# Patient Record
Sex: Female | Born: 1960 | Race: White | Hispanic: No | Marital: Married | Smoking: Former smoker
Health system: Southern US, Community
[De-identification: ages and names within clinical notes are randomized; demographics above are authoritative.]

## PROBLEM LIST (undated history)

## (undated) DIAGNOSIS — K5090 Crohn's disease, unspecified, without complications: Secondary | ICD-10-CM

## (undated) DIAGNOSIS — E039 Hypothyroidism, unspecified: Secondary | ICD-10-CM

## (undated) DIAGNOSIS — F329 Major depressive disorder, single episode, unspecified: Secondary | ICD-10-CM

## (undated) DIAGNOSIS — M1990 Unspecified osteoarthritis, unspecified site: Secondary | ICD-10-CM

## (undated) DIAGNOSIS — K602 Anal fissure, unspecified: Secondary | ICD-10-CM

## (undated) DIAGNOSIS — C50919 Malignant neoplasm of unspecified site of unspecified female breast: Secondary | ICD-10-CM

## (undated) DIAGNOSIS — K635 Polyp of colon: Secondary | ICD-10-CM

## (undated) DIAGNOSIS — K589 Irritable bowel syndrome without diarrhea: Secondary | ICD-10-CM

## (undated) DIAGNOSIS — F419 Anxiety disorder, unspecified: Secondary | ICD-10-CM

## (undated) DIAGNOSIS — M8580 Other specified disorders of bone density and structure, unspecified site: Secondary | ICD-10-CM

## (undated) DIAGNOSIS — K30 Functional dyspepsia: Secondary | ICD-10-CM

## (undated) DIAGNOSIS — Z8489 Family history of other specified conditions: Secondary | ICD-10-CM

## (undated) DIAGNOSIS — E0500 Thyrotoxicosis with diffuse goiter without thyrotoxic crisis or storm: Secondary | ICD-10-CM

## (undated) DIAGNOSIS — F32A Depression, unspecified: Secondary | ICD-10-CM

## (undated) DIAGNOSIS — E538 Deficiency of other specified B group vitamins: Secondary | ICD-10-CM

## (undated) DIAGNOSIS — Z92241 Personal history of systemic steroid therapy: Secondary | ICD-10-CM

## (undated) DIAGNOSIS — T7840XA Allergy, unspecified, initial encounter: Secondary | ICD-10-CM

## (undated) DIAGNOSIS — K219 Gastro-esophageal reflux disease without esophagitis: Secondary | ICD-10-CM

## (undated) HISTORY — PX: UPPER GASTROINTESTINAL ENDOSCOPY: SHX188

## (undated) HISTORY — DX: Deficiency of other specified B group vitamins: E53.8

## (undated) HISTORY — DX: Depression, unspecified: F32.A

## (undated) HISTORY — DX: Major depressive disorder, single episode, unspecified: F32.9

## (undated) HISTORY — DX: Polyp of colon: K63.5

## (undated) HISTORY — DX: Allergy, unspecified, initial encounter: T78.40XA

## (undated) HISTORY — PX: COLONOSCOPY W/ POLYPECTOMY: SHX1380

## (undated) HISTORY — DX: Anal fissure, unspecified: K60.2

## (undated) HISTORY — DX: Other specified disorders of bone density and structure, unspecified site: M85.80

## (undated) HISTORY — DX: Functional dyspepsia: K30

## (undated) HISTORY — DX: Irritable bowel syndrome, unspecified: K58.9

## (undated) HISTORY — DX: Anxiety disorder, unspecified: F41.9

## (undated) HISTORY — PX: POLYPECTOMY: SHX149

## (undated) HISTORY — PX: COLONOSCOPY: SHX174

---
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
## 1987-12-11 HISTORY — PX: TUBAL LIGATION: SHX77

## 1988-12-10 HISTORY — PX: TEMPOROMANDIBULAR JOINT SURGERY: SHX35

## 2000-08-29 ENCOUNTER — Other Ambulatory Visit: Admission: RE | Admit: 2000-08-29 | Discharge: 2000-08-29 | Payer: Self-pay | Admitting: Obstetrics & Gynecology

## 2000-09-16 ENCOUNTER — Encounter: Payer: Self-pay | Admitting: Obstetrics & Gynecology

## 2000-09-16 ENCOUNTER — Encounter: Admission: RE | Admit: 2000-09-16 | Discharge: 2000-09-16 | Payer: Self-pay | Admitting: Obstetrics & Gynecology

## 2000-12-19 ENCOUNTER — Encounter: Admission: RE | Admit: 2000-12-19 | Discharge: 2000-12-19 | Payer: Self-pay | Admitting: Obstetrics & Gynecology

## 2000-12-19 ENCOUNTER — Encounter: Payer: Self-pay | Admitting: Obstetrics & Gynecology

## 2001-01-14 ENCOUNTER — Encounter: Admission: RE | Admit: 2001-01-14 | Discharge: 2001-01-14 | Payer: Self-pay | Admitting: Emergency Medicine

## 2001-01-14 ENCOUNTER — Encounter: Payer: Self-pay | Admitting: Emergency Medicine

## 2001-01-17 ENCOUNTER — Inpatient Hospital Stay (HOSPITAL_COMMUNITY): Admission: RE | Admit: 2001-01-17 | Discharge: 2001-01-18 | Payer: Self-pay | Admitting: Gastroenterology

## 2001-01-22 ENCOUNTER — Ambulatory Visit (HOSPITAL_COMMUNITY): Admission: RE | Admit: 2001-01-22 | Discharge: 2001-01-22 | Payer: Self-pay | Admitting: Gastroenterology

## 2001-02-05 ENCOUNTER — Ambulatory Visit (HOSPITAL_COMMUNITY): Admission: RE | Admit: 2001-02-05 | Discharge: 2001-02-05 | Payer: Self-pay | Admitting: Internal Medicine

## 2001-02-05 ENCOUNTER — Encounter: Payer: Self-pay | Admitting: Internal Medicine

## 2001-05-27 ENCOUNTER — Emergency Department (HOSPITAL_COMMUNITY): Admission: EM | Admit: 2001-05-27 | Discharge: 2001-05-27 | Payer: Self-pay | Admitting: Plastic Surgery

## 2001-07-30 ENCOUNTER — Encounter: Payer: Self-pay | Admitting: Obstetrics & Gynecology

## 2001-07-30 ENCOUNTER — Encounter: Admission: RE | Admit: 2001-07-30 | Discharge: 2001-07-30 | Payer: Self-pay | Admitting: Obstetrics & Gynecology

## 2001-09-12 ENCOUNTER — Other Ambulatory Visit: Admission: RE | Admit: 2001-09-12 | Discharge: 2001-09-12 | Payer: Self-pay | Admitting: Obstetrics & Gynecology

## 2001-12-10 HISTORY — PX: OTHER SURGICAL HISTORY: SHX169

## 2002-03-19 ENCOUNTER — Inpatient Hospital Stay (HOSPITAL_COMMUNITY): Admission: AD | Admit: 2002-03-19 | Discharge: 2002-03-21 | Payer: Self-pay | Admitting: Obstetrics and Gynecology

## 2002-03-20 ENCOUNTER — Encounter: Payer: Self-pay | Admitting: Obstetrics & Gynecology

## 2002-03-26 ENCOUNTER — Encounter (INDEPENDENT_AMBULATORY_CARE_PROVIDER_SITE_OTHER): Payer: Self-pay | Admitting: Specialist

## 2002-03-26 ENCOUNTER — Ambulatory Visit (HOSPITAL_COMMUNITY): Admission: RE | Admit: 2002-03-26 | Discharge: 2002-03-26 | Payer: Self-pay | Admitting: Gastroenterology

## 2002-04-14 ENCOUNTER — Encounter: Payer: Self-pay | Admitting: Gastroenterology

## 2002-04-14 ENCOUNTER — Encounter: Admission: RE | Admit: 2002-04-14 | Discharge: 2002-04-14 | Payer: Self-pay | Admitting: Gastroenterology

## 2002-05-29 ENCOUNTER — Encounter: Payer: Self-pay | Admitting: Gastroenterology

## 2002-05-29 ENCOUNTER — Inpatient Hospital Stay (HOSPITAL_COMMUNITY): Admission: RE | Admit: 2002-05-29 | Discharge: 2002-06-04 | Payer: Self-pay | Admitting: Gastroenterology

## 2002-06-03 ENCOUNTER — Encounter: Payer: Self-pay | Admitting: Gastroenterology

## 2002-06-11 ENCOUNTER — Ambulatory Visit (HOSPITAL_COMMUNITY): Admission: RE | Admit: 2002-06-11 | Discharge: 2002-06-11 | Payer: Self-pay | Admitting: Gastroenterology

## 2002-06-16 ENCOUNTER — Encounter: Admission: RE | Admit: 2002-06-16 | Discharge: 2002-06-16 | Payer: Self-pay | Admitting: Gastroenterology

## 2002-06-16 ENCOUNTER — Encounter: Payer: Self-pay | Admitting: Gastroenterology

## 2002-09-02 ENCOUNTER — Encounter (HOSPITAL_COMMUNITY): Admission: RE | Admit: 2002-09-02 | Discharge: 2002-09-02 | Payer: Self-pay | Admitting: Gastroenterology

## 2002-10-01 ENCOUNTER — Encounter: Admission: RE | Admit: 2002-10-01 | Discharge: 2002-10-01 | Payer: Self-pay | Admitting: Gastroenterology

## 2002-10-01 ENCOUNTER — Encounter: Payer: Self-pay | Admitting: Gastroenterology

## 2002-10-26 ENCOUNTER — Encounter (HOSPITAL_COMMUNITY): Admission: RE | Admit: 2002-10-26 | Discharge: 2002-10-26 | Payer: Self-pay | Admitting: Gastroenterology

## 2002-11-04 ENCOUNTER — Other Ambulatory Visit: Admission: RE | Admit: 2002-11-04 | Discharge: 2002-11-04 | Payer: Self-pay | Admitting: Obstetrics & Gynecology

## 2002-11-18 ENCOUNTER — Encounter: Payer: Self-pay | Admitting: Obstetrics & Gynecology

## 2002-11-18 ENCOUNTER — Encounter: Admission: RE | Admit: 2002-11-18 | Discharge: 2002-11-18 | Payer: Self-pay | Admitting: Family Medicine

## 2002-11-20 ENCOUNTER — Encounter: Payer: Self-pay | Admitting: Gastroenterology

## 2002-11-20 ENCOUNTER — Encounter: Admission: RE | Admit: 2002-11-20 | Discharge: 2002-11-20 | Payer: Self-pay | Admitting: Gastroenterology

## 2002-12-10 HISTORY — PX: APPENDECTOMY: SHX54

## 2002-12-10 HISTORY — PX: ABDOMINAL HYSTERECTOMY: SHX81

## 2002-12-10 HISTORY — PX: BOWEL RESECTION: SHX1257

## 2003-11-23 ENCOUNTER — Ambulatory Visit (HOSPITAL_COMMUNITY): Admission: RE | Admit: 2003-11-23 | Discharge: 2003-11-23 | Payer: Self-pay | Admitting: Obstetrics & Gynecology

## 2004-01-31 ENCOUNTER — Encounter: Admission: RE | Admit: 2004-01-31 | Discharge: 2004-01-31 | Payer: Self-pay | Admitting: Gastroenterology

## 2004-12-12 ENCOUNTER — Ambulatory Visit (HOSPITAL_COMMUNITY): Admission: RE | Admit: 2004-12-12 | Discharge: 2004-12-12 | Payer: Self-pay | Admitting: Obstetrics & Gynecology

## 2004-12-27 ENCOUNTER — Other Ambulatory Visit: Admission: RE | Admit: 2004-12-27 | Discharge: 2004-12-27 | Payer: Self-pay | Admitting: Obstetrics & Gynecology

## 2005-11-12 ENCOUNTER — Encounter: Admission: RE | Admit: 2005-11-12 | Discharge: 2005-11-12 | Payer: Self-pay | Admitting: Gastroenterology

## 2006-03-25 ENCOUNTER — Other Ambulatory Visit: Admission: RE | Admit: 2006-03-25 | Discharge: 2006-03-25 | Payer: Self-pay | Admitting: Obstetrics and Gynecology

## 2007-07-03 ENCOUNTER — Ambulatory Visit (HOSPITAL_COMMUNITY): Admission: RE | Admit: 2007-07-03 | Discharge: 2007-07-03 | Payer: Self-pay | Admitting: Obstetrics & Gynecology

## 2007-11-29 ENCOUNTER — Emergency Department (HOSPITAL_COMMUNITY): Admission: EM | Admit: 2007-11-29 | Discharge: 2007-11-29 | Payer: Self-pay | Admitting: Family Medicine

## 2011-04-27 NOTE — H&P (Signed)
[facility]  Patient:    [name], [name] Visit Number: [id number] MRN: [id number]          Service Type: OBS Location: [place] 01 Attending Physician:  Renella Cunas Dictated by:   Maisie Fus, M.D. Admit Date:  03/19/2002 Discharge Date: 03/21/2002                           History and Physical  SOCIAL SECURITY NO.           [id number].  ADMITTING DIAGNOSIS:          Persistent right lower quadrant abdominal pain.  HISTORY OF PRESENT ILLNESS:   The patient is a 50 year old white married female, gravida 3, para 3, who has been followed in my office since September 2001.  At the time of her initial examination in September 2001, she had a completely normal GYN exam.  She presented to the office March 5 complaining of right lower quadrant lower abdominal and vaginal pain, which she stated started with the onset of her menses and had progressively worsened since the onset of her menses on February 23.  She also complained of dyspareunia.  On pelvic examination at that time, the uterus was felt to be slightly enlarged in size.  There was a tender cystic mass in the right adnexa, and on pelvic ultrasound there was a 1.7 x 3 x 1.3 complex cystic lesion in the right ovary with jagged borders and free fluid seen about the anterior and posterior cul-de-sac.  The left adnexa was normal.  The uterus itself was normal.  It was felt to be most consistent with a ruptured ovarian cyst, and she was given Vicodin for pain and scheduled to return to the office for follow-up in approximately four to five weeks.  On the day of admission she presented to the office complaining that her pain had continued.  She complained of chills and sweats, lower abdominal pressure, and abdominal bloating and nausea.  On examination in the office at that time, she was initially examined by Juanda Chance, my nurse practitioner, who felt that she did have a cystic  fullness in the pelvis and a pelvic ultrasound was obtained, and that ultrasound showed a 1.8 cm resolving cyst with internal echoes on the right side and a large amount of free fluid in the cul-de-sac.  Her abdominal exam at that time was remarkable for right lower quadrant tenderness with mild rebound referred to the right lower quadrant.  On pelvic exam she also was noted to have cervical motion tenderness.  Based on these findings she was admitted for additional management and evaluation.  REVIEW OF SYSTEMS:            Her current review of systems is otherwise negative.  PAST MEDICAL HISTORY:         The patient has a history of peptic ulcer disease.  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  She has previously had temporomandibular joint surgery approximately seven years ago and had a bilateral tubal ligation approximately 11 years ago.  ALLERGIES:                    Her only known allergy is to ASPIRIN.  SOCIAL HISTORY:               She has  never had a blood transfusion.  She does use one pack per day of cigarettes but does not use alcohol.  FAMILY HISTORY:               Noncontributory.  PHYSICAL EXAMINATION:  GENERAL:                      The patient is alert, oriented, and cooperative.  HEENT:                        Grossly within normal limits.  NECK:                         The thyroid gland is not palpably enlarged.  CHEST:                        Clear to auscultation.  CARDIAC:                      Normal sinus rhythm without murmurs, rubs, or gallops.  ABDOMEN:                      Remarkable as noted above with tenderness to deep palpation in the right lower quadrant and mild rebound.  PELVIC:                       Remarkable as noted above.  EXTREMITIES:                  Without cyanosis, clubbing, or edema.  ASSESSMENT:                   Persistent right lower  quadrant and right pelvic pain of undetermined etiology, initially thought to be related to a ruptured ovarian cyst but with resolution of the cyst and persistence of symptoms, the possibility of chronic appendicitis is entertained as well as other possible GYN pathology or GI pathology.  PLAN:                         Admission for IV antibiotics, for a CT scan of the abdomen and pelvis, and further management as appropriate. Dictated by:   Maisie Fus, M.D. Attending Physician:  Renella Cunas DD:  03/20/02 TD:  03/20/02 Job: [id number]

## 2011-04-27 NOTE — Procedures (Signed)
[place]  Patient:    [name], [name] Visit Number: [id number] MRN: [id number]          Service Type: END Location: ENDO Attending Physician:  Mauri Brooklyn Ii Dictated by:   Mickeal Skinner, M.D. Proc. Date: 03/26/02 Admit Date:  03/26/2002 Discharge Date: 03/26/2002   CC:         Maisie Fus, M.D.  Zella Richer Burnett Harry, M.D.   Procedure Report  DATE OF BIRTH:  09-13-1961.  REFERRING PHYSICIAN: 1. Zella Richer Burnett Harry, M.D. 2. Maisie Fus, M.D.  PROCEDURE PERFORMED:  Colonoscopy, distal ileoscopy, ileal biopsy and sigmoid polypectomy.  ENDOSCOPIST:  Mickeal Skinner, M.D.  INDICATIONS FOR PROCEDURE:  The patient is a 50 year old female, 64.  She was recently hospitalized with abdominal pain.  Abdominal CT revealed signs suggesting ileitis.  [name] also has undergone I-131 radioiodine therapy to treat hyperthyroidism.  PREMEDICATION:  Versed 10 mg, fentanyl 100 mcg.  ENDOSCOPE:  Olympus pediatric colonoscope.  DESCRIPTION OF PROCEDURE:  After obtaining informed consent, the patient was placed in the left lateral decubitus position.  I administered intravenous fentanyl and intravenous Versed to achieve conscious sedation for the procedure.  The patients blood pressure, oxygen saturation and cardiac rhythm were monitored throughout the procedure and documented in the medical record.  Anal inspection was normal.  Digital rectal exam was normal.  The Olympus pediatric video colonoscope was then introduced into the rectum and easily advanced to the cecum.  A somewhat inflamed ileocecal valve was intubated and the distal ileum inspected.  Colonic preparation for the exam today was excellent.  Rectum:  Normal.  Sigmoid colon and descending colon:  At 25 cm from the anal verge, a 2 mm sessile polyp was removed with electrocautery snare and submitted for pathologic interpretation.  There were scattered aphthous  type ulcers and mucosal hemorrhages involving the left colon.  Splenic flexure:  Normal.  Transverse colon:  Scattered mucosal hemorrhages and aphthous type ulcers.  Hepatic flexure:  Normal.  Ascending colon:  Scattered mucosal hemorrhages.  Cecum and ileocecal valve:  Scattered mucosal hemorrhages and aphthous type ulcers.  Ileocecal valve is quite edematous.  Distal ileum:  The distal ileum is very edematous, somewhat friable and there are scattered ulcers.  Biopsies were performed.  ASSESSMENT: 1. Ileitis--universal colitis with a normal-appearing rectum.  Rule out    Crohns ileocolitis. 2. Distal sigmoid colon polyp removed. Dictated by:   Mickeal Skinner, M.D. Attending Physician:  Mauri Brooklyn Ii DD:  03/26/02 TD:  03/26/02 Job: [id number]

## 2011-04-27 NOTE — Discharge Summary (Signed)
[facility]  Patient:    [name], [name] Visit Number: [id number] MRN: [id number]          Service Type: OBS Location: [place] 01 Attending Physician:  Renella Cunas Dictated by:   Maisie Fus, M.D. Admit Date:  03/19/2002 Discharge Date: 03/21/2002   CC:         Earle Gell, M.D.   Discharge Summary  DISCHARGE DIAGNOSES:          1. Right lower quadrant abdominal pain.                               2. Recently ruptured ovarian cyst.                               3. CT findings of thickening terminal ileum                                  and small-bowel loops in the pelvis,                                  possibly Crohns disease.  CONDITION UPON DISCHARGE:     Satisfactory.  ACTIVITY:                     Restricted.  DISCHARGE MEDICATIONS:        1. Tequin 400 mg q.d.                               2. Percocet 5 mg 1 to 2 q.4-6h. p.r.n.                                  postoperative pain.  DIET:                         Regular as tolerated.  FOLLOWUP:                     Return to my office in approximately five days for followup.  She is to contact Dr. Marcelene Butte office for GI followup.  HISTORY OF PRESENT ILLNESS:   Details of the present illness are recorded in the admission note as well as physical findings.  Physical findings were primarily remarkable for pelvic and lower abdominal tenderness.  LABORATORY DATA:              Done this admission, includes CT scan of the abdomen and pelvis with findings as noted in the Discharge Diagnoses.  CBC on admission showed a white count of 11.3 with a marked left shift with 80 neutrophils.  Her hemoglobin on admission was 11.8, platelet count 407,000. Followup CBC on the day of discharge included a white count of 10.7 with 74 neutrophils.  Hemoglobin 11.0, platelet count 380,000.  CMET panel on admission was normal.  CT scan as noted did show abnormal findings as noted  above.  HOSPITAL COURSE:              The patient was admitted on the hospital, started on intravenous fluids, started  on Tequin 400 mg IV per day.  She had laboratory evaluation as noted above.  She had some slight improvement over the course of her hospital stay but was still complaining of lower abdominal discomfort.  She did not have an acute abdomen at this point and requested discharge.  She was seen in consultation during this admission by Dr. Watt Climes for Dr. Earle Gell.  GI followup will be with Dr. Earle Gell. Dictated by:   Maisie Fus, M.D. Attending Physician:  Renella Cunas DD:  03/21/02 TD:  03/22/02 Job: [id number]

## 2011-04-27 NOTE — Consult Note (Signed)
[place]  Patient:    [name], PICADO Visit Number: [id number] MRN: [id number]          Service Type: OBS Location: 0881 1031 01 Attending Physician:  Eber Hong Ii Dictated by:   Jeryl Columbia, M.D. Consultation Date: 03/20/02 Admit Date:  03/19/2002   CC:         Jeryl Columbia, M.D.  Maisie Fus, M.D.  Garlan Fair III, M.D.   Consultation Report  HISTORY OF PRESENT ILLNESS:   The patient admitted with right upper quadrant pain.  CT scan showed a free sac of fluid in the pelvis.  She has been followed by Dr. Viona Gilmore. Evette Cristal for ovarian cyst but also there was some small bowel thickening compatible with a question of Crohns.  She has seen Dr. Garlan Fair III in the past for various GI complaints.  Workup has been nondiagnostic in the past, although I am not sure she had a small-bowel follow-through.  Her GI symptoms have waxed and waned, initially thought secondary to some hypothyroidism which she had gotten radiation for.  Over the last three weeks, she has had lower abdominal pain and cramping with some increase in her diarrhea, although she only goes after she eats.  It does not wake her up at night.  She has had some low grade fevers and night sweats, however, for about three weeks.  No significant eye complaints, joint swelling, or rashes.  She has not had much upper tract symptoms, nausea or vomiting.  PAST MEDICAL HISTORY:         Pertinent for tubal ligation, TMJ surgery, as well as the hypothyroidism.  SOCIAL HISTORY:               Does smoke.  Occasionally drinks.  Does use vitamin E but minimized other over-the-counter medicines.  CURRENT MEDICATIONS:          Birth control pills, Vicodin, Xanax.  FAMILY HISTORY:               Negative for any obvious GI problems, Crohns, or colitis.  REVIEW OF SYSTEMS:            Pertinent for some pressure with urination but her pain does not change with moving  her bowels and she is not seeing any blood.  She says she has lost about 30 pounds but most of that was when her thyroid was first diagnosed a year and a half ago.  PHYSICAL EXAMINATION:  GENERAL:                      No acute distress, lying comfortably in the bed, although is on PCA Morphine pump.  VITAL SIGNS:                  Stable and afebrile.  HEENT:                        Sclerae are nonicteric.  NECK:                         Supple without obvious adenopathy.  LUNGS:                        Clear.  HEART:  Regular rate and rhythm.  ABDOMEN:                      Soft.  There is some tenderness throughout. Doubt any rebound tenderness.  Very minimal lower guarding.  LABORATORY DATA:              Pertinent for a slight increase in white count of 11.3.  Segs 90%.  Hemoglobin 11.8 with a MCV of 86, platelets of 407,000. Chemistries normal including an albumin of 3.7.  Normal liver tests.  CT scan reportedly reviewed.  Pertinent for small bowel thickening involving the distal small bowel and some small amount of free pelvic fluid.  ASSESSMENT:                   Possible Crohns disease in a patient with pain, abnormal CT, off and on diarrhea, and weight loss, but also some of this could be due to her thyroid problem and possibly an ovarian cyst rupture would explain some of the fluid.  PLAN:                         We will go ahead and begin Pentasa in case this is Crohns disease.  Do not think we will need to start prednisone quite yet. We will need a small-bowel follow-through, colonoscopy with biopsy to prove the diagnosis, and I believe Dr. Garlan Fair III can probably proceed with that next week as an outpatient.  I think she could go home if we can control the pain since she is not having any nausea or vomiting.  Might want to try either a Fentanyl patch or Percocet.  We will follow with you and check on her tomorrow.  Agree with repeating  her CBC and agree with Tequin for now. Thank you very much for the consultation.Dictated by:   Jeryl Columbia, M.D.  Attending Physician:  Renella Cunas DD:  03/20/02 TD:  03/21/02 Job: [id number]

## 2012-09-11 ENCOUNTER — Other Ambulatory Visit: Payer: Self-pay | Admitting: Gastroenterology

## 2012-09-12 ENCOUNTER — Emergency Department (HOSPITAL_COMMUNITY): Payer: BC Managed Care – PPO

## 2012-09-12 ENCOUNTER — Encounter (HOSPITAL_COMMUNITY): Payer: Self-pay

## 2012-09-12 ENCOUNTER — Emergency Department (HOSPITAL_COMMUNITY)
Admission: EM | Admit: 2012-09-12 | Discharge: 2012-09-13 | Disposition: A | Discharge status: Home / Self Care | Payer: BC Managed Care – PPO | Attending: Emergency Medicine | Admitting: Emergency Medicine

## 2012-09-12 DIAGNOSIS — R142 Eructation: Secondary | ICD-10-CM | POA: Insufficient documentation

## 2012-09-12 DIAGNOSIS — E039 Hypothyroidism, unspecified: Secondary | ICD-10-CM | POA: Insufficient documentation

## 2012-09-12 DIAGNOSIS — R140 Abdominal distension (gaseous): Secondary | ICD-10-CM

## 2012-09-12 DIAGNOSIS — R141 Gas pain: Secondary | ICD-10-CM | POA: Insufficient documentation

## 2012-09-12 DIAGNOSIS — Z79899 Other long term (current) drug therapy: Secondary | ICD-10-CM | POA: Insufficient documentation

## 2012-09-12 DIAGNOSIS — R110 Nausea: Secondary | ICD-10-CM | POA: Insufficient documentation

## 2012-09-12 DIAGNOSIS — R109 Unspecified abdominal pain: Secondary | ICD-10-CM

## 2012-09-12 DIAGNOSIS — R197 Diarrhea, unspecified: Secondary | ICD-10-CM | POA: Insufficient documentation

## 2012-09-12 DIAGNOSIS — R143 Flatulence: Secondary | ICD-10-CM | POA: Insufficient documentation

## 2012-09-12 DIAGNOSIS — K5090 Crohn's disease, unspecified, without complications: Secondary | ICD-10-CM | POA: Insufficient documentation

## 2012-09-12 HISTORY — DX: Hypothyroidism, unspecified: E03.9

## 2012-09-12 HISTORY — DX: Thyrotoxicosis with diffuse goiter without thyrotoxic crisis or storm: E05.00

## 2012-09-12 HISTORY — DX: Crohn's disease, unspecified, without complications: K50.90

## 2012-09-12 LAB — CBC WITH DIFFERENTIAL/PLATELET
Basophils Absolute: 0 10*3/uL (ref 0.0–0.1)
Basophils Relative: 0 % (ref 0–1)
Eosinophils Absolute: 0.2 10*3/uL (ref 0.0–0.7)
Eosinophils Relative: 2 % (ref 0–5)
HCT: 40.7 % (ref 36.0–46.0)
Lymphocytes Relative: 28 % (ref 12–46)
Lymphs Abs: 1.9 10*3/uL (ref 0.7–4.0)
MCHC: 34.2 g/dL (ref 30.0–36.0)
MCV: 93.8 fL (ref 78.0–100.0)
Monocytes Absolute: 0.4 10*3/uL (ref 0.1–1.0)
Monocytes Relative: 6 % (ref 3–12)
Neutro Abs: 4.3 10*3/uL (ref 1.7–7.7)
Neutrophils Relative %: 63 % (ref 43–77)
Platelets: 253 10*3/uL (ref 150–400)
RBC: 4.34 MIL/uL (ref 3.87–5.11)
RDW: 12.3 % (ref 11.5–15.5)
WBC: 6.8 10*3/uL (ref 4.0–10.5)

## 2012-09-12 LAB — COMPREHENSIVE METABOLIC PANEL
ALT: 8 U/L (ref 0–35)
AST: 12 U/L (ref 0–37)
Albumin: 3.7 g/dL (ref 3.5–5.2)
CO2: 23 mEq/L (ref 19–32)
Calcium: 8.5 mg/dL (ref 8.4–10.5)
Chloride: 105 mEq/L (ref 96–112)
Creatinine, Ser: 0.65 mg/dL (ref 0.50–1.10)
GFR calc Af Amer: >90 mL/min (ref >90)
GFR calc non Af Amer: >90 mL/min (ref >90)
Glucose, Bld: 105 mg/dL — ABNORMAL HIGH (ref 70–99)
Potassium: 3.3 mEq/L — ABNORMAL LOW (ref 3.5–5.1)
Sodium: 137 mEq/L (ref 135–145)
Total Bilirubin: 0.6 mg/dL (ref 0.3–1.2)

## 2012-09-12 MED ORDER — SODIUM CHLORIDE 0.9 % IV BOLUS (SEPSIS)
1000.0000 mL | Freq: Once | INTRAVENOUS | Status: AC
  Administered 2012-09-13: 1000 mL via INTRAVENOUS

## 2012-09-12 MED ORDER — POTASSIUM CHLORIDE CRYS ER 20 MEQ PO TBCR
40.0000 meq | EXTENDED_RELEASE_TABLET | Freq: Once | ORAL | Status: AC
  Administered 2012-09-13: 40 meq via ORAL
  Filled 2012-09-12: qty 2

## 2012-09-12 MED ORDER — HYDROMORPHONE HCL PF 1 MG/ML IJ SOLN
1.0000 mg | Freq: Once | INTRAMUSCULAR | Status: DC
  Filled 2012-09-12: qty 1

## 2012-09-12 MED ORDER — HYDROMORPHONE HCL PF 1 MG/ML IJ SOLN
1.0000 mg | Freq: Once | INTRAMUSCULAR | Status: AC
  Administered 2012-09-12: 1 mg via INTRAVENOUS

## 2012-09-12 MED ORDER — IOHEXOL 300 MG/ML  SOLN
100.0000 mL | Freq: Once | INTRAMUSCULAR | Status: AC | PRN
  Administered 2012-09-12: 100 mL via INTRAVENOUS

## 2012-09-12 NOTE — ED Notes (Signed)
Pt had colonoscopy done yesterday for crohn. Pt today has abdominal distention with pain on right side of her abdomen, been having diarrhea since the colonoscopy. Pt is alert, oriented, appear in no acute distress. This is pt's fifth colonoscopy and never had this problem before.

## 2012-09-12 NOTE — ED Provider Notes (Signed)
History     CSN: [id number]  Arrival date & time 09/12/12  1804   First MD Initiated Contact with Patient 09/12/12 2042      Chief Complaint  Patient presents with  . Abdominal Pain    (Consider location/radiation/quality/duration/timing/severity/associated sxs/prior treatment) The history is provided by the patient.  [name] is a 51 y.o. female hx of Crohn's disease s/p partial bowel resection here with abdominal pain and distention. She had a colonoscopy yesterday and had a biopsy. After the procedure, she had distended abdomen and diffuse pain. + nausea but no vomiting. + passing gas and had some diarrhea. No fevers. She had multiple colonoscopy before but never had these symptoms.    Past Medical History  Diagnosis Date  . Crohn disease   . Graves disease   . Hypothyroidism     Past Surgical History  Procedure Date  . Radioiodine therapy   . Bowel resection 2004  . Abdominal hysterectomy   . Appendectomy     No family history on file.  History  Substance Use Topics  . Smoking status: Current Every Day Smoker  . Smokeless tobacco: Not on file  . Alcohol Use: No    OB History    Grav Para Term Preterm Abortions TAB SAB Ect Mult Living                  Review of Systems  Gastrointestinal: Positive for nausea, abdominal pain, diarrhea and abdominal distention.  All other systems reviewed and are negative.    Allergies  Aspirin adult low  Home Medications   Current Outpatient Rx  Name Route Sig Dispense Refill  . ALPRAZOLAM 1 MG PO TABS Oral Take 1 mg by mouth at bedtime.    Marland Kitchen VITAMIN D 1000 UNITS PO TABS Oral Take 1,000 Units by mouth daily.    Marland Kitchen ESTRADIOL 0.1 MG/24HR TD PTTW Transdermal Place 1 patch onto the skin 2 (two) times a week. Every 3 days    . LEVOTHYROXINE SODIUM 75 MCG PO TABS Oral Take 75 mcg by mouth daily.    . ADULT MULTIVITAMIN W/MINERALS CH Oral Take 1 tablet by mouth daily.    Marland Kitchen ZOLPIDEM TARTRATE 10 MG PO TABS Oral Take 10  mg by mouth at bedtime.      BP 104/43  Pulse 70  Temp 98 F (36.7 C) (Oral)  Resp 18  SpO2 100%  Physical Exam  Nursing note and vitals reviewed. Constitutional: She is oriented to person, place, and time. She appears well-developed.       Slightly uncomfortable   HENT:  Head: Normocephalic.  Mouth/Throat: Oropharynx is clear and moist.  Eyes: Conjunctivae normal are normal. Pupils are equal, round, and reactive to light.  Neck: Normal range of motion. Neck supple.  Cardiovascular: Normal rate, regular rhythm and normal heart sounds.   Pulmonary/Chest: Effort normal and breath sounds normal.  Abdominal: Soft.       Increased breath sound. + abdominal distension. Tenderness RLQ over the incision site and RUQ and periumbilical area. No rebound.   Musculoskeletal: Normal range of motion. She exhibits no edema.  Neurological: She is alert and oriented to person, place, and time.  Skin: Skin is warm and dry.  Psychiatric: She has a normal mood and affect. Her behavior is normal. Judgment and thought content normal.    ED Course  Procedures (including critical care time)  Labs Reviewed  COMPREHENSIVE METABOLIC PANEL - Abnormal; Notable for the following:  Potassium 3.3 (*)     Glucose, Bld 105 (*)     All other components within normal limits  CBC WITH DIFFERENTIAL   Dg Abd Acute W/chest  09/12/2012  *RADIOLOGY REPORT*  Clinical Data: Abdominal pain  ACUTE ABDOMEN SERIES (ABDOMEN 2 VIEW & CHEST 1 VIEW)  Comparison: CT 11/12/2005  Findings: Lungs clear.  Heart size normal.  No effusion.  No free air.  Normal bowel gas pattern.  Prominent liver.  No abnormal abdominal calcifications.  Regional bones unremarkable.  IMPRESSION:  1.  Normal bowel gas pattern. 2.  No free air.   Original Report Authenticated By: Dillard Cannon III, M.D.      1. Abdominal distention   2. Abdominal pain       MDM  [name] is a 51 y.o. female here with abdominal pain and distention.  Will need to r/o ileus vs perforation. Will get labs, abdominal xray and reassess.    11:25 PM I discussed with Dr. Durenda Age (patient's GI doctor) partner. If labs and CT nl, she will call the office on Monday. I signed out to Dr. Florina Ou to follow up CT ab/pel.       Wandra Arthurs, MD 09/12/12 [phone]

## 2012-09-12 NOTE — ED Notes (Signed)
Patient transported to CT 

## 2012-09-13 MED ORDER — OXYCODONE-ACETAMINOPHEN 5-325 MG PO TABS: 1.0000 | ORAL_TABLET | Freq: Four times a day (QID) | ORAL | Status: DC | PRN

## 2012-09-13 NOTE — ED Provider Notes (Signed)
Nursing notes and vitals signs, including pulse oximetry, reviewed.  Summary of this visit's results, reviewed by myself:  Labs:  Results for orders placed during the hospital encounter of 09/12/12  CBC WITH DIFFERENTIAL      Component Value Range   WBC 6.8  4.0 - 10.5 K/uL   RBC 4.34  3.87 - 5.11 MIL/uL   Hemoglobin 13.9  12.0 - 15.0 g/dL   HCT 40.7  36.0 - 46.0 %   MCV 93.8  78.0 - 100.0 fL   MCH 32.0  26.0 - 34.0 pg   MCHC 34.2  30.0 - 36.0 g/dL   RDW 12.3  11.5 - 15.5 %   Platelets 253  150 - 400 K/uL   Neutrophils Relative 63  43 - 77 %   Neutro Abs 4.3  1.7 - 7.7 K/uL   Lymphocytes Relative 28  12 - 46 %   Lymphs Abs 1.9  0.7 - 4.0 K/uL   Monocytes Relative 6  3 - 12 %   Monocytes Absolute 0.4  0.1 - 1.0 K/uL   Eosinophils Relative 2  0 - 5 %   Eosinophils Absolute 0.2  0.0 - 0.7 K/uL   Basophils Relative 0  0 - 1 %   Basophils Absolute 0.0  0.0 - 0.1 K/uL  COMPREHENSIVE METABOLIC PANEL      Component Value Range   Sodium 137  135 - 145 mEq/L   Potassium 3.3 (*) 3.5 - 5.1 mEq/L   Chloride 105  96 - 112 mEq/L   CO2 23  19 - 32 mEq/L   Glucose, Bld 105 (*) 70 - 99 mg/dL   BUN 7  6 - 23 mg/dL   Creatinine, Ser 0.65  0.50 - 1.10 mg/dL   Calcium 8.5  8.4 - 10.5 mg/dL   Total Protein 6.4  6.0 - 8.3 g/dL   Albumin 3.7  3.5 - 5.2 g/dL   AST 12  0 - 37 U/L   ALT 8  0 - 35 U/L   Alkaline Phosphatase 56  39 - 117 U/L   Total Bilirubin 0.6  0.3 - 1.2 mg/dL   GFR calc non Af Amer >90  >90 mL/min   GFR calc Af Amer >90  >90 mL/min    Imaging Studies: Ct Abdomen Pelvis W Contrast  09/13/2012  *RADIOLOGY REPORT*  Clinical Data: Abdominal pain.  Recent colonoscopy.  CT ABDOMEN AND PELVIS WITH CONTRAST  Technique:  Multidetector CT imaging of the abdomen and pelvis was performed following the standard protocol during bolus administration of intravenous contrast.  Contrast: 164m OMNIPAQUE IOHEXOL 300 MG/ML  SOLN  Comparison: 11/12/2005  Findings: Visualized lung bases clear.   Unremarkable liver, gallbladder, spleen, adrenal glands, kidneys.  There is mild dilatation of the pancreatic duct, slightly increased since previous exam.  Portal vein is patent.  Scattered aortoiliac atheromatous calcifications without aneurysm.  The stomach is physiologically distended.  Small bowel is nondilated.  Anastomotic staple line in the proximal ascending colon, new since prior study. The colon is incompletely distended, unremarkable. No focal inflammatory change.  No free air.  No ascites.  Urinary bladder incompletely distended.  No pelvic, retroperitoneal, or mesenteric adenopathy.  Mild spondylitic changes in the lower lumbar spine. No hydronephrosis or proximal ureterectasis.  IMPRESSION:  1.  No free air or other acute abdominal process. 2.  Interval postop changes in the proximal ascending colon.   Original Report Authenticated By: DTrecia Rogers M.D.    Dg Abd  Acute W/chest  09/12/2012  *RADIOLOGY REPORT*  Clinical Data: Abdominal pain  ACUTE ABDOMEN SERIES (ABDOMEN 2 VIEW & CHEST 1 VIEW)  Comparison: CT 11/12/2005  Findings: Lungs clear.  Heart size normal.  No effusion.  No free air.  Normal bowel gas pattern.  Prominent liver.  No abnormal abdominal calcifications.  Regional bones unremarkable.  IMPRESSION:  1.  Normal bowel gas pattern. 2.  No free air.   Original Report Authenticated By: Dillard Cannon III, M.D.     12:14 AM Patient still having some right lower quadrant pain and tenderness. Abdomen is soft. Patient was advised of unremarkable CT findings.  Wynetta Fines, MD 09/13/12 [phone]

## 2013-01-24 ENCOUNTER — Other Ambulatory Visit: Payer: Self-pay

## 2013-06-19 ENCOUNTER — Encounter (INDEPENDENT_AMBULATORY_CARE_PROVIDER_SITE_OTHER): Payer: Self-pay

## 2013-06-26 ENCOUNTER — Ambulatory Visit (INDEPENDENT_AMBULATORY_CARE_PROVIDER_SITE_OTHER): Payer: PRIVATE HEALTH INSURANCE | Admitting: General Surgery

## 2013-06-26 ENCOUNTER — Telehealth (INDEPENDENT_AMBULATORY_CARE_PROVIDER_SITE_OTHER): Payer: Self-pay | Admitting: General Surgery

## 2013-06-26 ENCOUNTER — Encounter (INDEPENDENT_AMBULATORY_CARE_PROVIDER_SITE_OTHER): Payer: Self-pay | Admitting: General Surgery

## 2013-06-26 VITALS — BP 122/70 | HR 84 | Resp 14 | Ht 60.0 in | Wt 114.0 lb

## 2013-06-26 DIAGNOSIS — K429 Umbilical hernia without obstruction or gangrene: Secondary | ICD-10-CM

## 2013-06-26 NOTE — Telephone Encounter (Signed)
Noted  

## 2013-06-26 NOTE — Progress Notes (Signed)
Patient ID: [name], female   DOB: [birth year], 52 y.o.   MRN: [id number]  Chief Complaint  Patient presents with  . Umbilical Hernia    HPI [name] is a 52 y.o. female.   HPI  She is referred by Dr. Earle Gell for further evaluation and treatment of a enlarging, slightly symptomatic umbilical hernia. She underwent ileocecectomy, TAH/BSO, and appendectomy in 2004. She had Crohn's disease. 1 year after that she noted a small bulge at the umbilicus. This has been getting larger slowly and sometimes uncomfortable. No obstructive symptoms.  Past Medical History  Diagnosis Date  . Crohn disease   . Graves disease   . Hypothyroidism   . Vitamin B12 deficiency   . Delayed gastric emptying   . Osteopenia     Past Surgical History  Procedure Laterality Date  . Radioiodine therapy    . Bowel resection  2004  . Abdominal hysterectomy  2004  . Appendectomy  2004  . Temporomandibular joint surgery  1990  . Tubal ligation  1989    Family History  Problem Relation Age of Onset  . Colon cancer Paternal Uncle     Social History History  Substance Use Topics  . Smoking status: Current Every Day Smoker -- 0.25 packs/day  . Smokeless tobacco: Not on file  . Alcohol Use: Yes     Comment: one every evening    Allergies  Allergen Reactions  . Mercaptopurine Hives, Shortness Of Breath and Swelling  . Aspirin Adult Low (Aspirin) Hives    Current Outpatient Prescriptions  Medication Sig Dispense Refill  . ALPRAZolam (XANAX) 1 MG tablet Take 1 mg by mouth at bedtime.      . cholecalciferol (VITAMIN D) 1000 UNITS tablet Take 1,000 Units by mouth daily.      . Cyanocobalamin (B-12 PO) Take by mouth. Injections and 1000 mcg by mouth      . estradiol (VIVELLE-DOT) 0.1 MG/24HR Place 1 patch onto the skin 2 (two) times a week. Every 3 days      . levothyroxine (SYNTHROID, LEVOTHROID) 75 MCG tablet Take 75 mcg by mouth daily.      . Multiple Vitamin (MULTIVITAMIN WITH MINERALS)  TABS Take 1 tablet by mouth daily.      Marland Kitchen zolpidem (AMBIEN) 10 MG tablet Take 10 mg by mouth at bedtime.       No current facility-administered medications for this visit.    Review of Systems Review of Systems  Constitutional: Negative.   Respiratory: Negative.   Cardiovascular: Negative.   Gastrointestinal: Positive for abdominal pain, diarrhea and abdominal distention.  Genitourinary: Negative.     Blood pressure 122/70, pulse 84, resp. rate 14, height 5' (1.524 m), weight 114 lb (51.71 kg).  Physical Exam Physical Exam  Constitutional: She appears well-developed and well-nourished. No distress.  HENT:  Head: Normocephalic and atraumatic.  Eyes: EOM are normal.  Cardiovascular: Normal rate and regular rhythm.   Pulmonary/Chest: Effort normal and breath sounds normal.  Abdominal: Soft. She exhibits no distension.  Lower transverse scars present. Umbilical bulge that is not completely reducible.  Musculoskeletal: She exhibits no edema.  Lymphadenopathy:    She has no cervical adenopathy.  Neurological: She is alert.  Skin: Skin is warm and dry.    Data Reviewed Notes from Dr. Wynetta Emery.  Assessment    Enlarging and mildly symptomatic umbilical hernia. She is interested in repair.     Plan    Umbilical hernia repair with mesh.  I  have discussed the procedure, risks, and aftercare.  Risks include but are not limited to bleeding, infection, wound problems, anesthesia, recurrence, injury to intestine, mesh problems.  We also discussed not knowing what the umbilicus would look like in the future.  She seems to understand and agrees with the plan.        Anyela Napierkowski J 06/26/2013, 12:01 PM

## 2013-06-26 NOTE — Patient Instructions (Signed)
[facility] _______[facility]  UMBILICAL HERNIA REPAIR: POST OP INSTRUCTIONS  Always review your discharge instruction sheet given to you by the facility where your surgery was performed. IF YOU HAVE DISABILITY OR FAMILY LEAVE FORMS, YOU MUST BRING THEM TO THE OFFICE FOR PROCESSING.   DO NOT GIVE THEM TO YOUR DOCTOR.  1. A  prescription for pain medication may be given to you upon discharge.  Take your pain medication as prescribed, if needed.  If narcotic pain medicine is not needed, then you may take acetaminophen (Tylenol) or ibuprofen (Advil) as needed. 2. Take your usually prescribed medications unless otherwise directed. 3. If you need a refill on your pain medication, please contact your pharmacy.  They will contact our office to request authorization. Prescriptions will not be filled after 5 pm or on week-ends. 4. You should follow a light diet the first 24 hours after arrival home, such as soup and crackers, etc.  Be sure to include lots of fluids daily.  Resume your normal diet the day after surgery. 5. Most patients will experience some swelling and bruising around the umbilicus.  Ice packs and reclining will help.  Swelling and bruising can take several days to resolve.  6. It is common to experience some constipation if taking pain medication after surgery.  Increasing fluid intake and taking a stool softener (such as Colace) will usually help or prevent this problem from occurring.  A mild laxative (Milk of Magnesia or Miralax) should be taken according to package directions if there are no bowel movements after 48 hours. 7. Unless discharge instructions indicate otherwise, you may remove your bandages 24-48 hours after surgery, and you may shower at that time.  You may have steri-strips (small skin tapes) in place directly over the incision.  These strips should be left on the skin for 7-10 days.  If your surgeon used skin glue on the incision, you may shower in 24 hours.  The  glue will flake off over the next 2-3 weeks.  Any sutures or staples will be removed at the office during your follow-up visit. 8. ACTIVITIES:  You may resume regular (light) daily activities beginning the next day-such as daily self-care, walking, climbing stairs-gradually increasing activities as tolerated.  You may have sexual intercourse when it is comfortable.  Refrain from any heavy lifting or straining until approved by your doctor. a. You may drive when you are no longer taking prescription pain medication, you can comfortably wear a seatbelt, and you can safely maneuver your car and apply brakes. b. RETURN TO WORK:  __________________________________________________________ 9. You should see your doctor in the office for a follow-up appointment approximately 2-3 weeks after your surgery.  Make sure that you call for this appointment within a day or two after you arrive home to insure a convenient appointment time. 10. OTHER INSTRUCTIONS:  __________________________________________________________________________________________________________________________________________________________________________________________  WHEN TO CALL YOUR DOCTOR: 1. Fever over 101.0 2. Inability to urinate 3. Nausea and/or vomiting 4. Extreme swelling or bruising 5. Continued bleeding from incision. 6. Increased pain, redness, or drainage from the incision  The clinic staff is available to answer your questions during regular business hours.  Please don't hesitate to call and ask to speak to one of the nurses for clinical concerns.  If you have a medical emergency, go to the nearest emergency room or call 911.  A surgeon from [facility] is always on call at the hospital   [address], [place], [place], [place]  [address] ?  [address], [place], [place]   [address] [phone] ? [phone] ? FAX [phone] Web site: [email or web address]

## 2013-06-26 NOTE — Telephone Encounter (Signed)
Advised patient of financial responsibility for surgery, per patient she wants to wait until Novemeber to have surgery done

## 2013-07-24 ENCOUNTER — Encounter (HOSPITAL_COMMUNITY): Payer: Self-pay | Admitting: Pharmacy Technician

## 2013-07-24 NOTE — Patient Instructions (Addendum)
[place]  07/24/2013   Your procedure is scheduled on: 08/06/13  Report to [facility] at 5:30 AM.  Call this number if you have problems the morning of surgery 336-: [phone]   Remember:   Do not eat food or drink liquids After Midnight.     Take these medicines the morning of surgery with A SIP OF WATER: synthroid   Do not wear jewelry, make-up or nail polish.  Do not wear lotions, powders, or perfumes. You may wear deodorant.  Do not shave 48 hours prior to surgery. Men may shave face and neck.  Do not bring valuables to the hospital.  Contacts, dentures or bridgework may not be worn into surgery.     Patients discharged the day of surgery will not be allowed to drive home.  Name and phone number of your driver: Ernestine Mcmurray [phone]    Paulette Blanch, RN  pre op nurse call if needed [phone]    FAILURE TO [place] OF YOUR SURGERY   Patient Signature: ___________________________________________

## 2013-07-27 ENCOUNTER — Encounter (HOSPITAL_COMMUNITY): Payer: Self-pay

## 2013-07-27 ENCOUNTER — Encounter (HOSPITAL_COMMUNITY)
Admission: RE | Admit: 2013-07-27 | Discharge: 2013-07-27 | Disposition: A | Discharge status: Home / Self Care | Payer: PRIVATE HEALTH INSURANCE | Source: Ambulatory Visit | Attending: General Surgery | Admitting: General Surgery

## 2013-07-27 DIAGNOSIS — Z01812 Encounter for preprocedural laboratory examination: Secondary | ICD-10-CM | POA: Insufficient documentation

## 2013-07-27 HISTORY — DX: Unspecified osteoarthritis, unspecified site: M19.90

## 2013-07-27 HISTORY — DX: Gastro-esophageal reflux disease without esophagitis: K21.9

## 2013-07-27 LAB — CBC WITH DIFFERENTIAL/PLATELET
Basophils Relative: 0 % (ref 0–1)
Eosinophils Absolute: 0.1 10*3/uL (ref 0.0–0.7)
Eosinophils Relative: 1 % (ref 0–5)
Lymphs Abs: 1.4 10*3/uL (ref 0.7–4.0)
MCH: 32.5 pg (ref 26.0–34.0)
MCHC: 34.3 g/dL (ref 30.0–36.0)
MCV: 94.7 fL (ref 78.0–100.0)
Neutrophils Relative %: 68 % (ref 43–77)
Platelets: 262 10*3/uL (ref 150–400)
RBC: 4.52 MIL/uL (ref 3.87–5.11)
RDW: 11.9 % (ref 11.5–15.5)

## 2013-07-27 LAB — COMPREHENSIVE METABOLIC PANEL
ALT: 20 U/L (ref 0–35)
Albumin: 4.1 g/dL (ref 3.5–5.2)
Alkaline Phosphatase: 56 U/L (ref 39–117)
Calcium: 10.3 mg/dL (ref 8.4–10.5)
GFR calc Af Amer: >90 mL/min (ref >90)
Glucose, Bld: 97 mg/dL (ref 70–99)
Potassium: 4 mEq/L (ref 3.5–5.1)
Sodium: 139 mEq/L (ref 135–145)
Total Protein: 7 g/dL (ref 6.0–8.3)

## 2013-07-27 LAB — PROTIME-INR: Prothrombin Time: 12.1 seconds (ref 11.6–15.2)

## 2013-08-06 ENCOUNTER — Ambulatory Visit (HOSPITAL_COMMUNITY): Payer: PRIVATE HEALTH INSURANCE | Admitting: Anesthesiology

## 2013-08-06 ENCOUNTER — Encounter (HOSPITAL_COMMUNITY)
Admission: RE | Disposition: A | Discharge status: Home / Self Care | Payer: Self-pay | Source: Ambulatory Visit | Attending: General Surgery

## 2013-08-06 ENCOUNTER — Encounter (HOSPITAL_COMMUNITY): Payer: Self-pay | Admitting: *Deleted

## 2013-08-06 ENCOUNTER — Ambulatory Visit (HOSPITAL_COMMUNITY)
Admission: RE | Admit: 2013-08-06 | Discharge: 2013-08-06 | Disposition: A | Discharge status: Home / Self Care | Payer: PRIVATE HEALTH INSURANCE | Source: Ambulatory Visit | Attending: General Surgery | Admitting: General Surgery

## 2013-08-06 ENCOUNTER — Encounter (HOSPITAL_COMMUNITY): Payer: Self-pay | Admitting: Anesthesiology

## 2013-08-06 DIAGNOSIS — M899 Disorder of bone, unspecified: Secondary | ICD-10-CM | POA: Insufficient documentation

## 2013-08-06 DIAGNOSIS — R197 Diarrhea, unspecified: Secondary | ICD-10-CM | POA: Insufficient documentation

## 2013-08-06 DIAGNOSIS — Z79899 Other long term (current) drug therapy: Secondary | ICD-10-CM | POA: Insufficient documentation

## 2013-08-06 DIAGNOSIS — K420 Umbilical hernia with obstruction, without gangrene: Secondary | ICD-10-CM | POA: Insufficient documentation

## 2013-08-06 DIAGNOSIS — K219 Gastro-esophageal reflux disease without esophagitis: Secondary | ICD-10-CM | POA: Insufficient documentation

## 2013-08-06 DIAGNOSIS — K5090 Crohn's disease, unspecified, without complications: Secondary | ICD-10-CM | POA: Insufficient documentation

## 2013-08-06 DIAGNOSIS — E0500 Thyrotoxicosis with diffuse goiter without thyrotoxic crisis or storm: Secondary | ICD-10-CM | POA: Insufficient documentation

## 2013-08-06 HISTORY — PX: UMBILICAL HERNIA REPAIR: SHX196

## 2013-08-06 SURGERY — REPAIR, HERNIA, UMBILICAL, ADULT
Anesthesia: General | Site: Abdomen | Wound class: Clean

## 2013-08-06 MED ORDER — PHENYLEPHRINE HCL 10 MG/ML IJ SOLN
INTRAMUSCULAR | Status: DC | PRN
  Administered 2013-08-06 (×2): 80 ug via INTRAVENOUS

## 2013-08-06 MED ORDER — HYDROMORPHONE HCL PF 1 MG/ML IJ SOLN
0.2500 mg | INTRAMUSCULAR | Status: DC | PRN
  Administered 2013-08-06 (×2): 0.25 mg via INTRAVENOUS

## 2013-08-06 MED ORDER — ACETAMINOPHEN 650 MG RE SUPP
650.0000 mg | RECTAL | Status: DC | PRN
  Filled 2013-08-06: qty 1

## 2013-08-06 MED ORDER — SODIUM CHLORIDE 0.9 % IJ SOLN: 3.0000 mL | INTRAMUSCULAR | Status: DC | PRN

## 2013-08-06 MED ORDER — MORPHINE SULFATE 10 MG/ML IJ SOLN: 2.0000 mg | INTRAMUSCULAR | Status: DC | PRN

## 2013-08-06 MED ORDER — ONDANSETRON HCL 4 MG/2ML IJ SOLN
INTRAMUSCULAR | Status: DC | PRN
  Administered 2013-08-06: 4 mg via INTRAVENOUS

## 2013-08-06 MED ORDER — OXYCODONE HCL 5 MG PO TABS
5.0000 mg | ORAL_TABLET | ORAL | Status: DC | PRN
  Administered 2013-08-06: 5 mg via ORAL

## 2013-08-06 MED ORDER — CEFAZOLIN SODIUM-DEXTROSE 2-3 GM-% IV SOLR
INTRAVENOUS | Status: AC
  Filled 2013-08-06: qty 50

## 2013-08-06 MED ORDER — NEOSTIGMINE METHYLSULFATE 1 MG/ML IJ SOLN
INTRAMUSCULAR | Status: DC | PRN
  Administered 2013-08-06: 4 mg via INTRAVENOUS

## 2013-08-06 MED ORDER — PROMETHAZINE HCL 25 MG/ML IJ SOLN: 6.2500 mg | INTRAMUSCULAR | Status: DC | PRN

## 2013-08-06 MED ORDER — FENTANYL CITRATE 0.05 MG/ML IJ SOLN
INTRAMUSCULAR | Status: DC | PRN
  Administered 2013-08-06 (×3): 50 ug via INTRAVENOUS

## 2013-08-06 MED ORDER — OXYCODONE HCL 5 MG PO TABS
5.0000 mg | ORAL_TABLET | Freq: Once | ORAL | Status: DC | PRN
  Filled 2013-08-06: qty 1

## 2013-08-06 MED ORDER — MEPERIDINE HCL 50 MG/ML IJ SOLN: 6.2500 mg | INTRAMUSCULAR | Status: DC | PRN

## 2013-08-06 MED ORDER — OXYCODONE HCL 5 MG/5ML PO SOLN
5.0000 mg | Freq: Once | ORAL | Status: DC | PRN
  Filled 2013-08-06: qty 5

## 2013-08-06 MED ORDER — BUPIVACAINE-EPINEPHRINE (PF) 0.5% -1:200000 IJ SOLN
INTRAMUSCULAR | Status: AC
  Filled 2013-08-06: qty 10

## 2013-08-06 MED ORDER — BUPIVACAINE-EPINEPHRINE 0.5% -1:200000 IJ SOLN
INTRAMUSCULAR | Status: DC | PRN
  Administered 2013-08-06 (×2): 10 mL

## 2013-08-06 MED ORDER — SUCCINYLCHOLINE CHLORIDE 20 MG/ML IJ SOLN
INTRAMUSCULAR | Status: DC | PRN
  Administered 2013-08-06: 100 mg via INTRAVENOUS

## 2013-08-06 MED ORDER — HYDROMORPHONE HCL PF 1 MG/ML IJ SOLN
INTRAMUSCULAR | Status: AC
  Filled 2013-08-06: qty 1

## 2013-08-06 MED ORDER — PROPOFOL 10 MG/ML IV BOLUS
INTRAVENOUS | Status: DC | PRN
  Administered 2013-08-06: 150 mg via INTRAVENOUS

## 2013-08-06 MED ORDER — LIDOCAINE HCL (CARDIAC) 20 MG/ML IV SOLN
INTRAVENOUS | Status: DC | PRN
  Administered 2013-08-06: 50 mg via INTRAVENOUS

## 2013-08-06 MED ORDER — 0.9 % SODIUM CHLORIDE (POUR BTL) OPTIME
TOPICAL | Status: DC | PRN
  Administered 2013-08-06: 1000 mL

## 2013-08-06 MED ORDER — CEFAZOLIN SODIUM-DEXTROSE 2-3 GM-% IV SOLR
2.0000 g | INTRAVENOUS | Status: AC
Start: 2013-08-06 — End: 2013-08-06
  Administered 2013-08-06: 2 g via INTRAVENOUS

## 2013-08-06 MED ORDER — ROCURONIUM BROMIDE 100 MG/10ML IV SOLN
INTRAVENOUS | Status: DC | PRN
  Administered 2013-08-06 (×2): 10 mg via INTRAVENOUS
  Administered 2013-08-06: 5 mg via INTRAVENOUS

## 2013-08-06 MED ORDER — ONDANSETRON HCL 4 MG/2ML IJ SOLN: 4.0000 mg | Freq: Four times a day (QID) | INTRAMUSCULAR | Status: DC | PRN

## 2013-08-06 MED ORDER — LACTATED RINGERS IV SOLN
INTRAVENOUS | Status: DC | PRN
  Administered 2013-08-06 (×2): via INTRAVENOUS

## 2013-08-06 MED ORDER — ACETAMINOPHEN 325 MG PO TABS: 650.0000 mg | ORAL_TABLET | ORAL | Status: DC | PRN

## 2013-08-06 MED ORDER — MIDAZOLAM HCL 5 MG/5ML IJ SOLN
INTRAMUSCULAR | Status: DC | PRN
  Administered 2013-08-06: 2 mg via INTRAVENOUS

## 2013-08-06 MED ORDER — OXYCODONE HCL 5 MG PO TABS: 5.0000 mg | ORAL_TABLET | ORAL | Status: DC | PRN

## 2013-08-06 MED ORDER — EPHEDRINE SULFATE 50 MG/ML IJ SOLN
INTRAMUSCULAR | Status: DC | PRN
  Administered 2013-08-06: 10 mg via INTRAVENOUS
  Administered 2013-08-06: 5 mg via INTRAVENOUS

## 2013-08-06 MED ORDER — GLYCOPYRROLATE 0.2 MG/ML IJ SOLN
INTRAMUSCULAR | Status: DC | PRN
  Administered 2013-08-06: 0.2 mg via INTRAVENOUS
  Administered 2013-08-06: .8 mg via INTRAVENOUS

## 2013-08-06 SURGICAL SUPPLY — 44 items
APL SKNCLS STERI-STRIP NONHPOA (GAUZE/BANDAGES/DRESSINGS) ×1
BENZOIN TINCTURE PRP APPL 2/3 (GAUZE/BANDAGES/DRESSINGS) ×2 IMPLANT
BLADE HEX COATED 2.75 (ELECTRODE) ×2 IMPLANT
BLADE SURG SZ10 CARB STEEL (BLADE) ×3 IMPLANT
CHLORAPREP W/TINT 26ML (MISCELLANEOUS) ×2 IMPLANT
CLOTH BEACON ORANGE TIMEOUT ST (SAFETY) ×2 IMPLANT
DECANTER SPIKE VIAL GLASS SM (MISCELLANEOUS) ×2 IMPLANT
DRAPE INCISE IOBAN 66X45 STRL (DRAPES) ×2 IMPLANT
DRAPE LAPAROTOMY T 102X78X121 (DRAPES) ×2 IMPLANT
DRSG TEGADERM 4X4.75 (GAUZE/BANDAGES/DRESSINGS) ×2 IMPLANT
ELECT REM PT RETURN 9FT ADLT (ELECTROSURGICAL) ×2
ELECTRODE REM PT RTRN 9FT ADLT (ELECTROSURGICAL) ×1 IMPLANT
GLOVE BIOGEL PI IND STRL 6.5 (GLOVE) IMPLANT
GLOVE BIOGEL PI IND STRL 7.0 (GLOVE) ×1 IMPLANT
GLOVE BIOGEL PI INDICATOR 6.5 (GLOVE) ×1
GLOVE BIOGEL PI INDICATOR 7.0 (GLOVE)
GLOVE ECLIPSE 8.0 STRL XLNG CF (GLOVE) ×2 IMPLANT
GLOVE INDICATOR 8.0 STRL GRN (GLOVE) ×4 IMPLANT
GLOVE SURG SS PI 6.5 STRL IVOR (GLOVE) ×1 IMPLANT
GOWN STRL NON-REIN LRG LVL3 (GOWN DISPOSABLE) ×2 IMPLANT
GOWN STRL REIN XL XLG (GOWN DISPOSABLE) ×3 IMPLANT
KIT BASIN OR (CUSTOM PROCEDURE TRAY) ×2 IMPLANT
MESH HERNIA 3X6 (Mesh General) ×1 IMPLANT
NDL HYPO 25X1 1.5 SAFETY (NEEDLE) IMPLANT
NEEDLE HYPO 22GX1.5 SAFETY (NEEDLE) ×2 IMPLANT
NEEDLE HYPO 25X1 1.5 SAFETY (NEEDLE) ×2 IMPLANT
NS IRRIG 1000ML POUR BTL (IV SOLUTION) ×2 IMPLANT
PACK BASIC VI WITH GOWN DISP (CUSTOM PROCEDURE TRAY) ×2 IMPLANT
PENCIL BUTTON HOLSTER BLD 10FT (ELECTRODE) ×2 IMPLANT
SOL PREP POV-IOD 16OZ 10% (MISCELLANEOUS) ×1 IMPLANT
SPONGE GAUZE 4X4 12PLY (GAUZE/BANDAGES/DRESSINGS) ×2 IMPLANT
SPONGE LAP 4X18 X RAY DECT (DISPOSABLE) ×2 IMPLANT
STRIP CLOSURE SKIN 1/2X4 (GAUZE/BANDAGES/DRESSINGS) ×2 IMPLANT
SUT MNCRL AB 4-0 PS2 18 (SUTURE) ×2 IMPLANT
SUT PROLENE 0 CT 2 (SUTURE) ×1 IMPLANT
SUT SURG 0 T 19/GS 22 1969 62 (SUTURE) ×2 IMPLANT
SUT VIC AB 2-0 BRD 54 (SUTURE) ×1 IMPLANT
SUT VIC AB 3-0 SH 27 (SUTURE) ×2
SUT VIC AB 3-0 SH 27XBRD (SUTURE) ×1 IMPLANT
SYR BULB IRRIGATION 50ML (SYRINGE) ×2 IMPLANT
SYR CONTROL 10ML LL (SYRINGE) ×2 IMPLANT
TOWEL OR 17X26 10 PK STRL BLUE (TOWEL DISPOSABLE) ×2 IMPLANT
TOWEL OR NON WOVEN STRL DISP B (DISPOSABLE) ×1 IMPLANT
YANKAUER SUCT BULB TIP 10FT TU (MISCELLANEOUS) ×2 IMPLANT

## 2013-08-06 NOTE — Anesthesia Postprocedure Evaluation (Signed)
Anesthesia Post Note  Patient: [name]  Procedure(s) Performed: Procedure(s) (LRB): HERNIA REPAIR UMBILICAL ADULT (N/A)  Anesthesia type: General  Patient location: PACU  Post pain: Pain level controlled  Post assessment: Post-op Vital signs reviewed  Last Vitals: BP 90/44  Pulse 62  Temp(Src) 36.4 C (Oral)  Resp 13  SpO2 97%  Post vital signs: Reviewed  Level of consciousness: sedated  Complications: No apparent anesthesia complications

## 2013-08-06 NOTE — Transfer of Care (Signed)
Immediate Anesthesia Transfer of Care Note  Patient: [name]  Procedure(s) Performed: Procedure(s): HERNIA REPAIR UMBILICAL ADULT (N/A)  Patient Location: PACU  Anesthesia Type:General  Level of Consciousness: awake, alert  and oriented  Airway & Oxygen Therapy: Patient Spontanous Breathing and Patient connected to face mask oxygen  Post-op Assessment: Report given to PACU RN and Post -op Vital signs reviewed and stable  Post vital signs: Reviewed and stable  Complications: No apparent anesthesia complications

## 2013-08-06 NOTE — Op Note (Signed)
Preoperative diagnosis: Umbilical hernia  Postoperative diagnosis: Chronically incarcerated umbilical hernia  Procedure: Umbilical hernia repair with mesh.  Surgeon: Jackolyn Confer M.D.  Anesthesia: General plus Marcaine local   Indication:  This is a 52 year old female with a  Enlarging and mildly symptomatic umbilical hernia who presents for repair. The procedure, risks, and after care were discussed with her preoperatively.  Technique: She was seen in the holding room and then brought to the operating room, placed supine on the operating table, and a general anesthetic was given.  The hair in the periumbilical area was clipped as was necessary.  The periumbilical area was sterilely prepped and draped.  Marcaine solution was infiltrated superficially and deep in the periumbilical area. A subumbilical transverse incision was made through the skin and subcutaneous tissue until the fascia was identified. The subcutaneous tissue was mobilized free from the fascia inferiorly and laterally. The umbilical stalk was mobilized and dissected free from the fascia exposing the underlying hernia defect. The hernia sac and tissue within it could not be reduced back into the abdominal cavity consistent with a chronically incarcerated umbilical hernia.  The sac was opened and omentum was in the hernia. This was partially excised and sent to pathology. After doing this I was able to reduce the sac and omentum back into the peritoneal cavity.  The subcutaneous tissue was freed from the fascia for 3-4 cm around the primary defect. The primary defect was then closed with interrupted 0 Surgilon sutures. The sutures were left long.  A piece of polypropylene mesh was brought into the field. The primary repair sutures were threaded up through the mesh and tied down anchoring the mesh directly over the primary repair. The periphery of the mesh was then anchored to the fascia with a running 0 Prolene suture to allow for  3-4 cm of mesh overlap. The excess mesh was trimmed and removed.  Hemostasis was adequate at this time. The umbilicus was reimplanted onto the mesh and fascia with 3-0 Vicryl suture. The subcutaneous tissue was closed over the mesh with a running 3-0 Vicryl suture. The skin was closed with a 4-0 Monocryl subcuticular stitch. Steri-Strips and sterile dressings were applied.  She tolerated the procedure well without any apparent complications and was taken to the recovery room in satisfactory condition.

## 2013-08-06 NOTE — Anesthesia Preprocedure Evaluation (Signed)
Anesthesia Evaluation  Patient identified by MRN, date of birth, ID band Patient awake    Reviewed: Allergy & Precautions, H&P , NPO status , Patient's Chart, lab work & pertinent test results  Airway Mallampati: II TM Distance: >3 FB Neck ROM: Full    Dental  (+) Dental Advisory Given and Teeth Intact   Pulmonary Current Smoker,  breath sounds clear to auscultation        Cardiovascular negative cardio ROS  Rhythm:Regular Rate:Normal     Neuro/Psych negative neurological ROS  negative psych ROS   GI/Hepatic negative GI ROS, Neg liver ROS, GERD-  Medicated,  Endo/Other  Hypothyroidism Hyperthyroidism   Renal/GU negative Renal ROS     Musculoskeletal negative musculoskeletal ROS (+)   Abdominal   Peds  Hematology negative hematology ROS (+)   Anesthesia Other Findings   Reproductive/Obstetrics negative OB ROS                           Anesthesia Physical Anesthesia Plan  ASA: II  Anesthesia Plan: General   Post-op Pain Management:    Induction: Intravenous  Airway Management Planned:   Additional Equipment:   Intra-op Plan:   Post-operative Plan: Extubation in OR  Informed Consent: I have reviewed the patients History and Physical, chart, labs and discussed the procedure including the risks, benefits and alternatives for the proposed anesthesia with the patient or authorized representative who has indicated his/her understanding and acceptance.   Dental advisory given  Plan Discussed with: CRNA  Anesthesia Plan Comments:         Anesthesia Quick Evaluation

## 2013-08-06 NOTE — Preoperative (Signed)
Beta Blockers   Reason not to administer Beta Blockers:Not Applicable 

## 2013-08-06 NOTE — H&P (Signed)
[name] is an 52 y.o. female.   Chief Complaint:   Here for elective surgery HPI: She has an enlarging, mildly symptomatic umbilical hernia and presents for repair.  Past Medical History  Diagnosis Date  . Crohn disease   . Vitamin B12 deficiency   . Delayed gastric emptying   . Osteopenia   . Graves disease     started with this  . Hypothyroidism     current  . GERD (gastroesophageal reflux disease)     occasional  . Arthritis     Past Surgical History  Procedure Laterality Date  . Radioiodine therapy  2003  . Bowel resection  2004  . Appendectomy  2004  . Temporomandibular joint surgery Left 1990  . Tubal ligation  1989  . Abdominal hysterectomy  2004    complete    Family History  Problem Relation Age of Onset  . Colon cancer Paternal Uncle    Social History:  reports that she has been smoking Cigarettes.  She has a .5 pack-year smoking history. She has never used smokeless tobacco. She reports that  drinks alcohol. She reports that she does not use illicit drugs.  Allergies:  Allergies  Allergen Reactions  . Mercaptopurine Hives, Shortness Of Breath and Swelling  . Aspirin Adult Low [Aspirin] Hives  . Gluten Meal     Distend, abd pain, fever, chills    Medications Prior to Admission  Medication Sig Dispense Refill  . ALPRAZolam (XANAX) 1 MG tablet Take 1 mg by mouth at bedtime.      . cholecalciferol (VITAMIN D) 1000 UNITS tablet Take 1,000 Units by mouth daily.      . cyanocobalamin (,VITAMIN B-12,) 1000 MCG/ML injection Inject 1,000 mcg into the muscle once.      Marland Kitchen levothyroxine (SYNTHROID, LEVOTHROID) 75 MCG tablet Take 75 mcg by mouth daily before breakfast.       . Multiple Vitamin (MULTIVITAMIN WITH MINERALS) TABS Take 1 tablet by mouth daily.      Marland Kitchen OVER THE COUNTER MEDICATION Take 1 capsule by mouth daily. Chinese herbs, taken two hours after or before any medicaitons      . vitamin B-12 (CYANOCOBALAMIN) 1000 MCG tablet Take 1,000 mcg by mouth  daily.      Marland Kitchen zolpidem (AMBIEN) 10 MG tablet Take 10 mg by mouth at bedtime.      Marland Kitchen estradiol (VIVELLE-DOT) 0.1 MG/24HR patch Place 1 patch onto the skin 2 (two) times a week.        No results found for this or any previous visit (from the past 48 hour(s)). No results found.  Review of Systems  Constitutional: Negative for fever and chills.  HENT: Negative for congestion.   Gastrointestinal: Positive for abdominal pain (mild in lower area) and diarrhea.    Blood pressure 104/52, pulse 71, temperature 98.3 F (36.8 C), temperature source Oral, resp. rate 18, SpO2 100.00%. Physical Exam  Constitutional: She appears well-developed and well-nourished. No distress.  HENT:  Head: Normocephalic and atraumatic.  Cardiovascular: Normal rate and regular rhythm.   Respiratory: Effort normal and breath sounds normal.  GI: Soft.  Umbilical hernia that is not completely reducible.  Lower transverse scar.  Musculoskeletal: She exhibits no edema.  Skin: Skin is warm and dry.     Assessment/Plan Umbilical hernia.  Plan:  Umbilical hernia repair with mesh.  Cahlil Sattar J 08/06/2013, 7:32 AM

## 2013-08-07 ENCOUNTER — Encounter (HOSPITAL_COMMUNITY): Payer: Self-pay | Admitting: General Surgery

## 2013-08-12 ENCOUNTER — Telehealth (INDEPENDENT_AMBULATORY_CARE_PROVIDER_SITE_OTHER): Payer: Self-pay

## 2013-08-12 NOTE — Telephone Encounter (Signed)
She was having a little Crohn's flare preop so I suspect that it was it is.  Try six smaller meals a day.

## 2013-08-12 NOTE — Telephone Encounter (Signed)
Spoke to patient, discussed that her Chron's may be the cause of her abdominal bloating and discomfort she's having.  Advised patient to try eating smaller meals a day, maybe try eating six smaller meals a day.  Patient states she has called her GI physician Dr. Wynetta Emery for advice as well.  Patient advised to feel free to call our office if she has any questions or concerns.  Patient verbalized understanding.

## 2013-08-12 NOTE — Telephone Encounter (Signed)
Patient calling in to office to report that she has bloating and abdominal distention that last 4-5 hours after eating meals.  Patient state she has Crohn's Disease and wasn't sure if maybe that was the cause of her abd bloating and distention.  Patient reports that her pain at times can be dull or stinging sensation.  Patient reports that she's having bowel movements.  Patient denies having any fevers, nausea or vomiting.  Patient taking her pain medication as directed.  Patient wasn't sure if she needed to be seen urgently or to keep her post op appointment on 08/19/13.  Patient s/p Hernia Repair on 08/06/2013.

## 2013-08-13 ENCOUNTER — Other Ambulatory Visit: Payer: Self-pay | Admitting: Gastroenterology

## 2013-08-13 ENCOUNTER — Ambulatory Visit
Admission: RE | Admit: 2013-08-13 | Discharge: 2013-08-13 | Disposition: A | Discharge status: Home / Self Care | Payer: PRIVATE HEALTH INSURANCE | Source: Ambulatory Visit | Attending: Gastroenterology | Admitting: Gastroenterology

## 2013-08-13 DIAGNOSIS — R109 Unspecified abdominal pain: Secondary | ICD-10-CM

## 2013-08-14 ENCOUNTER — Ambulatory Visit
Admission: RE | Admit: 2013-08-14 | Discharge: 2013-08-14 | Disposition: A | Discharge status: Home / Self Care | Payer: PRIVATE HEALTH INSURANCE | Source: Ambulatory Visit | Attending: Gastroenterology | Admitting: Gastroenterology

## 2013-08-14 DIAGNOSIS — R109 Unspecified abdominal pain: Secondary | ICD-10-CM

## 2013-08-14 MED ORDER — IOHEXOL 300 MG/ML  SOLN
100.0000 mL | Freq: Once | INTRAMUSCULAR | Status: AC | PRN
  Administered 2013-08-14: 100 mL via INTRAVENOUS

## 2013-08-19 ENCOUNTER — Encounter (INDEPENDENT_AMBULATORY_CARE_PROVIDER_SITE_OTHER): Payer: Self-pay | Admitting: General Surgery

## 2013-08-19 ENCOUNTER — Ambulatory Visit (INDEPENDENT_AMBULATORY_CARE_PROVIDER_SITE_OTHER): Payer: PRIVATE HEALTH INSURANCE | Admitting: General Surgery

## 2013-08-19 VITALS — BP 117/62 | HR 64 | Temp 98.6°F | Resp 14 | Ht 60.0 in | Wt 112.4 lb

## 2013-08-19 DIAGNOSIS — Z9889 Other specified postprocedural states: Secondary | ICD-10-CM

## 2013-08-19 NOTE — Progress Notes (Signed)
She presents for a postop visit after umbilical hernia repair with mesh.  She has some discomfort and swelling around the site with some bruising.  She is having neck U. Flare of her Crohn's disease. She saw Dr. Wynetta Emery and he wants to start treatment.  PE:  ABD:  Soft, subumbilical incision is clean/dry/intact, some swelling and bruising is present,  repair feels solid.  Assessment:  Doing well postop.  Plan:  Light activities for a total of 6 weeks postop. Then resume normal activities as tolerated and we discussed this. I asked her to take vitamin A for 2 weeks when she starts her Crohn's disease treatment. She may remove her Steri-Strips in one week apply scar cream. Return visit when necessary.

## 2013-08-19 NOTE — Patient Instructions (Signed)
Resume normal activities as tolerated 6 weeks from the date of surgery as we discussed.  Removed Steri-Strips next week. May apply scar cream at that time.  Take vitamin A for 2 weeks when you start Crohn's disease treatment.

## 2013-09-04 ENCOUNTER — Other Ambulatory Visit: Payer: Self-pay | Admitting: Gastroenterology

## 2013-09-04 DIAGNOSIS — K5080 Crohn's disease of both small and large intestine without complications: Secondary | ICD-10-CM

## 2013-09-14 ENCOUNTER — Other Ambulatory Visit: Payer: PRIVATE HEALTH INSURANCE

## 2013-10-15 ENCOUNTER — Other Ambulatory Visit: Payer: Self-pay

## 2013-12-07 ENCOUNTER — Ambulatory Visit
Admission: RE | Admit: 2013-12-07 | Discharge: 2013-12-07 | Disposition: A | Discharge status: Home / Self Care | Payer: PRIVATE HEALTH INSURANCE | Source: Ambulatory Visit | Attending: Gastroenterology | Admitting: Gastroenterology

## 2013-12-07 DIAGNOSIS — K5080 Crohn's disease of both small and large intestine without complications: Secondary | ICD-10-CM

## 2014-11-09 ENCOUNTER — Other Ambulatory Visit: Payer: Self-pay | Admitting: Dermatology

## 2015-01-31 ENCOUNTER — Other Ambulatory Visit: Payer: Self-pay | Admitting: Obstetrics and Gynecology

## 2015-02-01 LAB — CYTOLOGY - PAP

## 2015-09-07 ENCOUNTER — Other Ambulatory Visit: Payer: Self-pay | Admitting: Gastroenterology

## 2015-09-07 ENCOUNTER — Ambulatory Visit
Admission: RE | Admit: 2015-09-07 | Discharge: 2015-09-07 | Disposition: A | Discharge status: Home / Self Care | Payer: 59 | Source: Ambulatory Visit | Attending: Gastroenterology | Admitting: Gastroenterology

## 2015-09-07 DIAGNOSIS — K50819 Crohn's disease of both small and large intestine with unspecified complications: Secondary | ICD-10-CM

## 2015-09-14 ENCOUNTER — Other Ambulatory Visit: Payer: Self-pay | Admitting: Gastroenterology

## 2015-09-14 DIAGNOSIS — K56600 Partial intestinal obstruction, unspecified as to cause: Secondary | ICD-10-CM

## 2015-09-20 ENCOUNTER — Ambulatory Visit
Admission: RE | Admit: 2015-09-20 | Discharge: 2015-09-20 | Disposition: A | Discharge status: Home / Self Care | Payer: 59 | Source: Ambulatory Visit | Attending: Gastroenterology | Admitting: Gastroenterology

## 2015-09-20 DIAGNOSIS — K56600 Partial intestinal obstruction, unspecified as to cause: Secondary | ICD-10-CM

## 2015-10-27 ENCOUNTER — Other Ambulatory Visit: Payer: Self-pay | Admitting: Gastroenterology

## 2015-10-27 ENCOUNTER — Encounter (HOSPITAL_COMMUNITY): Payer: Self-pay | Admitting: *Deleted

## 2015-11-01 ENCOUNTER — Ambulatory Visit (HOSPITAL_COMMUNITY): Payer: 59 | Admitting: Certified Registered Nurse Anesthetist

## 2015-11-01 ENCOUNTER — Encounter (HOSPITAL_COMMUNITY): Payer: Self-pay

## 2015-11-01 ENCOUNTER — Encounter (HOSPITAL_COMMUNITY)
Admission: RE | Disposition: A | Discharge status: Home / Self Care | Payer: 59 | Source: Ambulatory Visit | Attending: Gastroenterology

## 2015-11-01 ENCOUNTER — Ambulatory Visit (HOSPITAL_COMMUNITY)
Admission: RE | Admit: 2015-11-01 | Discharge: 2015-11-01 | Disposition: A | Discharge status: Home / Self Care | Payer: 59 | Source: Ambulatory Visit | Attending: Gastroenterology | Admitting: Gastroenterology

## 2015-11-01 DIAGNOSIS — K5000 Crohn's disease of small intestine without complications: Secondary | ICD-10-CM | POA: Diagnosis not present

## 2015-11-01 DIAGNOSIS — E039 Hypothyroidism, unspecified: Secondary | ICD-10-CM | POA: Insufficient documentation

## 2015-11-01 DIAGNOSIS — M1990 Unspecified osteoarthritis, unspecified site: Secondary | ICD-10-CM | POA: Insufficient documentation

## 2015-11-01 DIAGNOSIS — K5669 Other intestinal obstruction: Secondary | ICD-10-CM | POA: Diagnosis not present

## 2015-11-01 DIAGNOSIS — F17200 Nicotine dependence, unspecified, uncomplicated: Secondary | ICD-10-CM | POA: Diagnosis not present

## 2015-11-01 DIAGNOSIS — Z980 Intestinal bypass and anastomosis status: Secondary | ICD-10-CM | POA: Diagnosis not present

## 2015-11-01 DIAGNOSIS — K621 Rectal polyp: Secondary | ICD-10-CM | POA: Diagnosis not present

## 2015-11-01 HISTORY — DX: Personal history of systemic steroid therapy: Z92.241

## 2015-11-01 HISTORY — PX: COLONOSCOPY WITH PROPOFOL: SHX5780

## 2015-11-01 SURGERY — COLONOSCOPY WITH PROPOFOL
Anesthesia: Monitor Anesthesia Care

## 2015-11-01 MED ORDER — PROMETHAZINE HCL 25 MG/ML IJ SOLN: 6.2500 mg | INTRAMUSCULAR | Status: DC | PRN

## 2015-11-01 MED ORDER — PROPOFOL 500 MG/50ML IV EMUL
INTRAVENOUS | Status: DC | PRN
  Administered 2015-11-01: 125 ug/kg/min via INTRAVENOUS
  Administered 2015-11-01: 100 ug/kg/min via INTRAVENOUS

## 2015-11-01 MED ORDER — PROPOFOL 10 MG/ML IV BOLUS
INTRAVENOUS | Status: AC
  Filled 2015-11-01: qty 40

## 2015-11-01 MED ORDER — SODIUM CHLORIDE 0.9 % IV SOLN: INTRAVENOUS | Status: DC

## 2015-11-01 MED ORDER — FENTANYL CITRATE (PF) 100 MCG/2ML IJ SOLN: 25.0000 ug | INTRAMUSCULAR | Status: DC | PRN

## 2015-11-01 MED ORDER — PROPOFOL 500 MG/50ML IV EMUL
INTRAVENOUS | Status: DC | PRN
  Administered 2015-11-01 (×3): 30 mg via INTRAVENOUS

## 2015-11-01 MED ORDER — LACTATED RINGERS IV SOLN
INTRAVENOUS | Status: DC
  Administered 2015-11-01: 1000 mL via INTRAVENOUS
  Administered 2015-11-01: 10:00:00 via INTRAVENOUS

## 2015-11-01 MED ORDER — PHENYLEPHRINE HCL 10 MG/ML IJ SOLN
INTRAMUSCULAR | Status: DC | PRN
  Administered 2015-11-01: 80 ug via INTRAVENOUS

## 2015-11-01 SURGICAL SUPPLY — 22 items

## 2015-11-01 NOTE — H&P (Signed)
  Problem: Crohn's ileitis complicated by partial small bowel obstruction. 09/20/2015 barium upper GI with small bowel follow-through x-ray series showed edema and small ulcerations in the distal terminal ileum consistent with active and chronic changes of Crohn's disease. Slight narrowing of the snare he terminal aspect of the ileum was noted. 09/11/2012 normal surveillance colonoscopy performed. Terminal ileum and cecum resection performed in 2004. Remote total abdominal hysterectomy with bilateral salpingo-oophorectomy  History: The patient is a 54 year old female born 11-29-1961. She has chronic Crohn's ileitis and underwent resection of the terminal ileum and cecum in 2004. She has developed symptoms consistent with a partial small bowel obstruction which has not resolved after prednisone followed by Entocort. Barium small bowel follow-through x-ray series showed edema and small ulcerations in the distal terminal ileum consistent with active and chronic changes of Crohn's disease. There was slight narrowing of the very distal terminal ileum.  The patient is scheduled to undergo diagnostic colonoscopy with possible dilation of an ileo-stricture.  Past medical history: Left TMJ surgery. Tubal ligation. Appendectomy. Total abdominal hysterectomy. Bilateral salpingo-oophorectomy. Terminal ileum-cecum resection in 2004. 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 Graves hyperthyroidism treated by radioactive iodine therapy leading to hypothyroidism. Delayed gastric emptying. Vitamin B 12 deficiency.  Medication allergies: Aspirin. 6-mercaptopurine.  Exam: The patient is alert and lying comfortably on the endoscopy stretcher. Abdomen is soft and nontender to palpation. Lungs are clear to patient. Cardiac exam reveals a regular rhythm.  Plan: Proceed with diagnostic colonoscopy with possible ileal stricture dilation.

## 2015-11-01 NOTE — Op Note (Signed)
Procedure: Diagnostic colonoscopy to evaluate small bowel obstructive symptoms. 09/20/2015 barium upper GI with small bowel follow-through x-ray series showed edema and small ulcerations in the distal terminal ileum consistent with active and chronic changes of Crohn's disease. Slight narrowing of the very terminal aspect of the ileum was noted without obstruction. 09/11/2012 normal surveillance colonoscopy performed. April 2004 10 inches of terminal ileum and cecum were resected with repair of an ileo-sigmoid colon fistula performed by Dr. Harlon Ditty at [facility]. Aspirin and 6-mercaptopurine allergic.  Endoscopist: Earle Gell  Premedication: Propofol administered by anesthesia  Procedure: The patient was placed in the left lateral decubitus position. Anal inspection and digital rectal exam were normal. The Pentax pediatric colonoscope was introduced into the rectum and advanced to the ileo-right colonic surgical anastomosis. Colonic preparation for the exam today was good. Withdrawal time was 9 minutes  Rectum. From the mid rectum, a 7 mm pedunculated polyp was removed with the electrocautery snare. Retroflexed view of the distal rectum was normal  Sigmoid colon and descending colon. Normal  Splenic flexure. Normal  Transverse colon. Normal  Hepatic flexure. Normal  Ascending colon. Normal  Ileo-right colonic surgical anastomosis. Normal  Assessment: A small polyp was removed from the rectum with the electrocautery snare. Otherwise normal colonoscopy performed to the ileo-right colonic surgical anastomosis. No signs of active Crohn's colitis and the ileo-right colonic surgical anastomosis appeared normal.  Recommendation: Refer to Dr. Harlon Ditty at [facility] if small bowel obstructive symptoms persist post colonoscopy.

## 2015-11-01 NOTE — Transfer of Care (Signed)
Immediate Anesthesia Transfer of Care Note  Patient: [name]  Procedure(s) Performed: Procedure(s): COLONOSCOPY WITH PROPOFOL (N/A)  Patient Location: PACU  Anesthesia Type:MAC  Level of Consciousness: awake, alert  and oriented  Airway & Oxygen Therapy: Patient Spontanous Breathing and Patient connected to face mask oxygen  Post-op Assessment: Report given to RN and Post -op Vital signs reviewed and stable  Post vital signs: Reviewed and stable  Last Vitals:  Filed Vitals:   11/01/15 0927  BP: 110/44  Pulse: 78  Temp: 36.9 C  Resp: 12    Complications: No apparent anesthesia complications

## 2015-11-01 NOTE — Anesthesia Preprocedure Evaluation (Signed)
Anesthesia Evaluation  Patient identified by MRN, date of birth, ID band Patient awake    Reviewed: Allergy & Precautions, NPO status , Patient's Chart, lab work & pertinent test results  History of Anesthesia Complications Negative for: history of anesthetic complications  Airway Mallampati: III  TM Distance: >3 FB Neck ROM: Full    Dental  (+) Teeth Intact, Dental Advisory Given   Pulmonary Current Smoker,    Pulmonary exam normal breath sounds clear to auscultation       Cardiovascular Exercise Tolerance: Good (-) hypertension(-) angina(-) CAD, (-) Past MI and (-) CHF negative cardio ROS Normal cardiovascular exam Rhythm:Regular Rate:Normal     Neuro/Psych negative neurological ROS  negative psych ROS   GI/Hepatic Neg liver ROS, GERD  Medicated,Crohn's disease    Endo/Other  Hypothyroidism   Renal/GU negative Renal ROS     Musculoskeletal  (+) Arthritis ,   Abdominal   Peds  Hematology negative hematology ROS (+)   Anesthesia Other Findings Day of surgery medications reviewed with the patient.  Reproductive/Obstetrics                             Anesthesia Physical Anesthesia Plan  ASA: II  Anesthesia Plan: MAC   Post-op Pain Management:    Induction: Intravenous  Airway Management Planned: Nasal Cannula  Additional Equipment:   Intra-op Plan:   Post-operative Plan: Extubation in OR  Informed Consent: I have reviewed the patients History and Physical, chart, labs and discussed the procedure including the risks, benefits and alternatives for the proposed anesthesia with the patient or authorized representative who has indicated his/her understanding and acceptance.   Dental advisory given  Plan Discussed with: CRNA  Anesthesia Plan Comments: (Risks/benefits of general anesthesia discussed with patient including risk of damage to teeth, lips, gum, and tongue,  nausea/vomiting, allergic reactions to medications, and the possibility of heart attack, stroke and death.  All patient questions answered.  Patient wishes to proceed.)        Anesthesia Quick Evaluation

## 2015-11-01 NOTE — Discharge Instructions (Signed)
Colonoscopy, Care After These instructions give you information on caring for yourself after your procedure. Your doctor may also give you more specific instructions. Call your doctor if you have any problems or questions after your procedure. HOME CARE  Do not drive for 24 hours.  Do not sign important papers or use machinery for 24 hours.  You may shower.  You may go back to your usual activities, but go slower for the first 24 hours.  Take rest breaks often during the first 24 hours.  Walk around or use warm packs on your belly (abdomen) if you have belly cramping or gas.  Drink enough fluids to keep your pee (urine) clear or pale yellow.  Resume your normal diet. Avoid heavy or fried foods.  Avoid drinking alcohol for 24 hours or as told by your doctor.  Only take medicines as told by your doctor. If a tissue sample (biopsy) was taken during the procedure:   Do not take aspirin or blood thinners for 7 days, or as told by your doctor.  Do not drink alcohol for 7 days, or as told by your doctor.  Eat soft foods for the first 24 hours. GET HELP IF: You still have a small amount of blood in your poop (stool) 2-3 days after the procedure. GET HELP RIGHT AWAY IF:  You have more than a small amount of blood in your poop.  You see clumps of tissue (blood clots) in your poop.  Your belly is puffy (swollen).  You feel sick to your stomach (nauseous) or throw up (vomit).  You have a fever.  You have belly pain that gets worse and medicine does not help. MAKE SURE YOU:  Understand these instructions.  Will watch your condition.  Will get help right away if you are not doing well or get worse.   This information is not intended to replace advice given to you by your health care provider. Make sure you discuss any questions you have with your health care provider.   Document Released: 12/29/2010 Document Revised: 12/01/2013 Document Reviewed: 08/03/2013 Elsevier  Interactive Patient Education 2016 Elsevier Inc.  Moderate Conscious Sedation, Adult, Care After Refer to this sheet in the next few weeks. These instructions provide you with information on caring for yourself after your procedure. Your health care provider may also give you more specific instructions. Your treatment has been planned according to current medical practices, but problems sometimes occur. Call your health care provider if you have any problems or questions after your procedure. WHAT TO EXPECT AFTER THE PROCEDURE  After your procedure:  You may feel sleepy, clumsy, and have poor balance for several hours.  Vomiting may occur if you eat too soon after the procedure. HOME CARE INSTRUCTIONS  Do not participate in any activities where you could become injured for at least 24 hours. Do not:  Drive.  Swim.  Ride a bicycle.  Operate heavy machinery.  Cook.  Use power tools.  Climb ladders.  Work from a high place.  Do not make important decisions or sign legal documents until you are improved.  If you vomit, drink water, juice, or soup when you can drink without vomiting. Make sure you have little or no nausea before eating solid foods.  Only take over-the-counter or prescription medicines for pain, discomfort, or fever as directed by your health care provider.  Make sure you and your family fully understand everything about the medicines given to you, including what side effects may occur.  You should not drink alcohol, take sleeping pills, or take medicines that cause drowsiness for at least 24 hours.  If you smoke, do not smoke without supervision.  If you are feeling better, you may resume normal activities 24 hours after you were sedated.  Keep all appointments with your health care provider. SEEK MEDICAL CARE IF:  Your skin is pale or bluish in color.  You continue to feel nauseous or vomit.  Your pain is getting worse and is not helped by  medicine.  You have bleeding or swelling.  You are still sleepy or feeling clumsy after 24 hours. SEEK IMMEDIATE MEDICAL CARE IF:  You develop a rash.  You have difficulty breathing.  You develop any type of allergic problem.  You have a fever. MAKE SURE YOU:  Understand these instructions.  Will watch your condition.  Will get help right away if you are not doing well or get worse.   This information is not intended to replace advice given to you by your health care provider. Make sure you discuss any questions you have with your health care provider.   Document Released: 09/16/2013 Document Revised: 12/17/2014 Document Reviewed: 09/16/2013 Elsevier Interactive Patient Education [employer].

## 2015-11-02 ENCOUNTER — Encounter (HOSPITAL_COMMUNITY): Payer: Self-pay | Admitting: Gastroenterology

## 2015-11-06 NOTE — Anesthesia Postprocedure Evaluation (Signed)
Anesthesia Post Note  Patient: [name]  Procedure(s) Performed: Procedure(s) (LRB): COLONOSCOPY WITH PROPOFOL (N/A)  Patient location during evaluation: PACU Anesthesia Type: MAC Level of consciousness: awake and alert Pain management: pain level controlled Vital Signs Assessment: post-procedure vital signs reviewed and stable Respiratory status: spontaneous breathing, nonlabored ventilation, respiratory function stable and patient connected to nasal cannula oxygen Cardiovascular status: stable and blood pressure returned to baseline Anesthetic complications: no    Last Vitals:  Filed Vitals:   11/01/15 1100 11/01/15 1110  BP: 97/45 99/48  Pulse: 65 67  Temp:    Resp: 14 13    Last Pain:  Filed Vitals:   11/02/15 1434  PainSc: 0-No pain                 Catalina Gravel

## 2017-05-15 ENCOUNTER — Encounter: Payer: Self-pay | Admitting: Gastroenterology

## 2017-06-21 ENCOUNTER — Encounter: Payer: Self-pay | Admitting: Gastroenterology

## 2017-06-21 ENCOUNTER — Ambulatory Visit (INDEPENDENT_AMBULATORY_CARE_PROVIDER_SITE_OTHER): Payer: 59 | Admitting: Gastroenterology

## 2017-06-21 VITALS — BP 102/70 | HR 72 | Ht 60.0 in | Wt 133.0 lb

## 2017-06-21 DIAGNOSIS — Z79899 Other long term (current) drug therapy: Secondary | ICD-10-CM | POA: Diagnosis not present

## 2017-06-21 DIAGNOSIS — K529 Noninfective gastroenteritis and colitis, unspecified: Secondary | ICD-10-CM | POA: Diagnosis not present

## 2017-06-21 DIAGNOSIS — K50019 Crohn's disease of small intestine with unspecified complications: Secondary | ICD-10-CM

## 2017-06-21 DIAGNOSIS — F17200 Nicotine dependence, unspecified, uncomplicated: Secondary | ICD-10-CM | POA: Diagnosis not present

## 2017-06-21 MED ORDER — DICYCLOMINE HCL 10 MG PO CAPS: 10.0000 mg | ORAL_CAPSULE | Freq: Three times a day (TID) | ORAL | 3 refills | Status: DC | PRN

## 2017-06-21 MED ORDER — RIFAXIMIN 550 MG PO TABS: 550.0000 mg | ORAL_TABLET | Freq: Three times a day (TID) | ORAL | 0 refills | Status: DC

## 2017-06-21 NOTE — Patient Instructions (Addendum)
If you are age 56 or older, your body mass index should be between 23-30. Your Body mass index is 25.97 kg/m. If this is out of the aforementioned range listed, please consider follow up with your Primary Care Provider.  If you are age 72 or younger, your body mass index should be between 19-25. Your Body mass index is 25.97 kg/m. If this is out of the aformentioned range listed, please consider follow up with your Primary Care Provider.   Your physician has requested that you go to the basement for the lab work in 3 months; 09/21/17  We have sent the following medications to your pharmacy for you to pick up at your convenience: Bentyl  You have been given samples of Xifaxan.  If this works, please call the office for a prescription.  You have been given a FODmap handout.  Please call the office to set up MRI enterography in two months.  Follow up with me in three months.  Please contact the office for an appointment schedule not available.  Thank you for choosing me and [facility].   Bivalve Cellar, MD

## 2017-06-21 NOTE — Progress Notes (Signed)
HPI :  56 y/o female with a history of Crohn's disease, osteopenia, h/o Graves diseases referred by Dr. Earle Gell for consultation and long term management of Crohn's disease.   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 Wynetta Emery Colonoscopy 09/2012 - normal UGI small bowel follow through 09/2015 - edema and ulcerations in the small bowel  Currently on Humira. Over the years she has been on 6MP in the past which did not work - she was on this in 2004, she states she had an allergy to it (edema), she has been on methotrexate in the past (tried a few times a long time ago - no benefit), been on Remicade - remotely on it a few months - she did not think she got any beenfit and it stopped, enterocort / steroids, and she has been enrolled in a clinic trial for a medication at [facility] remotely but she was not sure of the drug.   She was started on Humira in April 2018.  She does not like the injection part of it, due to discomfort which reliably occurs for a few seconds with injections. She is taking it every 2 weeks, not missing any doses. She has put on about 20 lbs since being on it. She had been on Enterocort most recently prior to taking Humira. She reports she gains weight on steroids, but has continued while on Humira. Baseline  weight around 110 lbs, currently 133 lbs.   She has about 6-7 BMs per day (previously was around 12 BMs per day) on Humira, stools usually loose. No blood in the stools. She also has abdominal pain, which is mostly cramping, but soreness and tenderness in her abdomen. Eating doesn't reliably cause pain but it does induce a bowel movement. She has a lot of bloating / gas which bothers her. She has never had been a trial of Rifaximin. She thinks since starting Humira she has had benefit in general, but does have ongoing symptoms.   She  has not had her Humira levels checked. She does not routinely use NSAIDs, only rarely for headaches.  She smokes tobacco, she has tried quitting in the past but have not been able to. She has tried multiple regimens.  DEXA  - due next month, history of osteopenia.  IBD Health Care Maintenance: Annual Flu Vaccine - Date - flu shot declined, history of reaction? Pneumococcal Vaccine if receiving immunosuppression: - Date - February 2018 per patient Skin check / derm eval for those on biologic / thiopuriner, yearly: Date - skin exam last June DEXA scan if risk factors for osteoporosis - Date: due next month TB testing if on anti-TNF, yearly - Date - March 2018 per patient Vitamin D screening - Date Last Colonoscopy - Date - 10/2015 - in remission  Labs from Dr. Durenda Age office:  05/30/17: Normal BMET anf LFTs  CBC - 7.9, Hgb 15.1, 270 platelets, MCV 93    Past Medical History:  Diagnosis Date  . Anal fissure   . Anxiety   . Arthritis    -knees, hips- is bothersome now  . Colon polyps   . Crohn disease ([place])    10-27-15   . Delayed gastric emptying   . Depression   . GERD (gastroesophageal reflux disease)    occasional  . Graves disease    started with this  . H/O steroid therapy  presently on tapering doses of Prednisone, Entercort.  . Hypothyroidism    current  . IBS (irritable bowel syndrome)   . Osteopenia   . Vitamin B12 deficiency    remains under tx.  injections every 4 weeks-today a little more that 4 weeks.     Past Surgical History:  Procedure Laterality Date  . ABDOMINAL HYSTERECTOMY  2004   complete  . APPENDECTOMY  2004  . BOWEL RESECTION  2004   Crohns  . COLONOSCOPY W/ POLYPECTOMY    . COLONOSCOPY WITH PROPOFOL N/A 11/01/2015   Procedure: COLONOSCOPY WITH PROPOFOL;  Surgeon: Garlan Fair, MD;  Location: [facility];  Service: Endoscopy;  Laterality: N/A;  . radioiodine therapy  2003  . TEMPOROMANDIBULAR JOINT SURGERY Left 1990  . TUBAL  LIGATION  1989  . UMBILICAL HERNIA REPAIR N/A 08/06/2013   Procedure: HERNIA REPAIR UMBILICAL ADULT;  Surgeon: Odis Hollingshead, MD;  Location: [facility];  Service: General;  Laterality: N/A;  WITH MESH   Family History  Problem Relation Age of Onset  . Irritable bowel syndrome Mother   . Colon polyps Father   . Colon cancer Paternal Uncle   . Diabetes Maternal Grandmother        type 2  . Irritable bowel syndrome Maternal Grandmother    Social History  Substance Use Topics  . Smoking status: Current Every Day Smoker    Packs/day: 0.25    Years: 2.00    Types: Cigarettes  . Smokeless tobacco: Never Used  . Alcohol use Yes     Comment: social   Current Outpatient Prescriptions  Medication Sig Dispense Refill  . Adalimumab (HUMIRA) 40 MG/0.8ML PSKT Inject 40 mg into the skin every 14 (fourteen) days.    . B Complex-C (SUPER B COMPLEX PO) Take 1 tablet by mouth daily.    . Biotin (BIOTIN 5000) 5 MG CAPS Take 1 capsule by mouth daily.    . Cholecalciferol (VITAMIN D3) 5000 units CAPS Take 1 capsule by mouth daily.    . Coenzyme Q10 (CO Q-10) 300 MG CAPS Take 1 capsule by mouth daily.    . Cyanocobalamin 1000 MCG/ML LIQD Take 1,000 mcg by mouth daily.    . Digestive Enzymes (PAPAYA ENZYME) TABS Take 1 tablet by mouth daily.    Marland Kitchen estradiol (VIVELLE-DOT) 0.05 MG/24HR patch Place 1 patch onto the skin 2 (two) times a week.    . levothyroxine (SYNTHROID, LEVOTHROID) 100 MCG tablet Take 100 mcg by mouth daily before breakfast.    . Multiple Vitamin (MULTIVITAMIN WITH MINERALS) TABS Take 1 tablet by mouth daily.    . Omega-3 Fatty Acids (FISH OIL PO) Take 3,000 mg by mouth daily.    . Probiotic Product (PROBIOTIC DAILY) CAPS Take 1 capsule by mouth daily. 30 bill     No current facility-administered medications for this visit.    Allergies  Allergen Reactions  . Mercaptopurine Hives, Shortness Of Breath and Swelling  . Aspirin Adult Low [Aspirin] Hives  . Gluten Meal     Distend, abd  pain, fever, chills     Review of Systems: All systems reviewed and negative except where noted in HPI.    No results found. Labs per HPI above  Physical Exam: BP 102/70 (BP Location: Left Arm, Patient Position: Sitting, Cuff Size: Normal)   Pulse 72   Ht 5' (1.524 m) Comment: height measured without shoes  Wt 133 lb (60.3 kg)   BMI 25.97 kg/m  Constitutional: Pleasant,well-developed, female in no  acute distress. HEENT: Normocephalic and atraumatic. Conjunctivae are normal. No scleral icterus. Neck supple.  Cardiovascular: Normal rate, regular rhythm.  Pulmonary/chest: Effort normal and breath sounds normal. No wheezing, rales or rhonchi. Abdominal: Soft, nondistended, nontender.  There are no masses palpable. No hepatomegaly. Extremities: no edema Lymphadenopathy: No cervical adenopathy noted. Neurological: Alert and oriented to person place and time. Skin: Skin is warm and dry. No rashes noted. Psychiatric: Normal mood and affect. Behavior is normal.   ASSESSMENT AND PLAN: 56 year old female here to establish care after Dr. Durenda Age retirement, for what appears to be mostly ileal Crohn's disease. History as outlined above, on multiple prior regimens, history of steroid use with development of osteopenia. Most recently on Humira for the past few months and has clearly had some benefit on this regimen, however continues to have some chronic symptoms including loose stools with significant gas and bloating. It also possible she has a component of concomitant SIBO or IBS-D. I discussed her history at length with her, her current medication regimen and associated risks of Humira, long-term plan. She does have chronic tobacco use which we discussed and recommend quitting if she is able to, she has had significant difficulty quitting tobacco in the past. Her recent labs look good / stable.  Recommend the following at this time: - continue Humira. We may check her Humira levels in the  upcoming months pending her course - repeat CBC, CMET, CRP, and vitamin D in 3 months - trial of rifaximin 557m TID for one week to empirically treat for possible SIBO. I was able to obtain a free sample for her, if this helps, may consider a longer course of it - trial of low FODMAP diet for her bloating - add bentyl 160mq 8 hrs PRN for cramping - plan on MR enterography in 2 months or so (a few more months out from starting Humira) to restage her disease after having a significant change in her therapy, and assess response to Humira - tobacco cessation if possible - follow up in clinic in 3 months for reassessment.   All questions answered, patient in agreement with plan.  StCarolina CellarMD [facility] Pager [phone]: JoGarlan FairMD

## 2017-07-22 ENCOUNTER — Other Ambulatory Visit: Payer: Self-pay

## 2017-07-22 ENCOUNTER — Telehealth: Payer: Self-pay | Admitting: Gastroenterology

## 2017-07-22 NOTE — Telephone Encounter (Signed)
She is actually one month early, Dr. Havery Moros wanted her to wait 2 months before calling back to office to schedule MRI. She did let me know about medications changes, stopped Xifaxan, Bentyl as they made symptoms worse. She will call back next month. Let her know that her labs are ordered and she can come to lab in Oct prior to her follow up visit scheduled on 10/17.

## 2017-07-22 NOTE — Progress Notes (Signed)
Patient states she stopped the Xifaxan, made her symptoms worse. She also stopped the Bentyl as she said this does not help. She has made dietary changes through a nutritionist which seem to help the most.

## 2017-07-26 ENCOUNTER — Telehealth: Payer: Self-pay

## 2017-08-07 NOTE — Telephone Encounter (Signed)
Encounter opened in error. gm

## 2017-08-30 ENCOUNTER — Other Ambulatory Visit: Payer: Self-pay

## 2017-08-30 ENCOUNTER — Telehealth: Payer: Self-pay

## 2017-08-30 DIAGNOSIS — K50019 Crohn's disease of small intestine with unspecified complications: Secondary | ICD-10-CM

## 2017-08-30 NOTE — Telephone Encounter (Signed)
Patient called to schedule her MR enterography, order placed for [facility]. Spoke to scheduler, they will contact the patient to go over screening questions.

## 2017-09-03 ENCOUNTER — Telehealth: Payer: Self-pay

## 2017-09-03 ENCOUNTER — Other Ambulatory Visit: Payer: Self-pay

## 2017-09-03 DIAGNOSIS — K50919 Crohn's disease, unspecified, with unspecified complications: Secondary | ICD-10-CM

## 2017-09-04 NOTE — Telephone Encounter (Signed)
Error

## 2017-09-13 ENCOUNTER — Other Ambulatory Visit: Payer: Self-pay | Admitting: Gastroenterology

## 2017-09-13 ENCOUNTER — Ambulatory Visit
Admission: RE | Admit: 2017-09-13 | Discharge: 2017-09-13 | Disposition: A | Discharge status: Home / Self Care | Payer: 59 | Source: Ambulatory Visit | Attending: Gastroenterology | Admitting: Gastroenterology

## 2017-09-13 ENCOUNTER — Other Ambulatory Visit (INDEPENDENT_AMBULATORY_CARE_PROVIDER_SITE_OTHER): Payer: 59

## 2017-09-13 DIAGNOSIS — K5000 Crohn's disease of small intestine without complications: Secondary | ICD-10-CM

## 2017-09-13 DIAGNOSIS — K50919 Crohn's disease, unspecified, with unspecified complications: Secondary | ICD-10-CM

## 2017-09-13 DIAGNOSIS — K50019 Crohn's disease of small intestine with unspecified complications: Secondary | ICD-10-CM

## 2017-09-13 LAB — CBC WITH DIFFERENTIAL/PLATELET
BASOS PCT: 0.6 % (ref 0.0–3.0)
Basophils Absolute: 0.1 10*3/uL (ref 0.0–0.1)
EOS ABS: 0.1 10*3/uL (ref 0.0–0.7)
EOS PCT: 1.5 % (ref 0.0–5.0)
HCT: 42.9 % (ref 36.0–46.0)
Hemoglobin: 14.9 g/dL (ref 12.0–15.0)
Lymphocytes Relative: 28.4 % (ref 12.0–46.0)
Lymphs Abs: 2.5 10*3/uL (ref 0.7–4.0)
MCHC: 34.8 g/dL (ref 30.0–36.0)
MCV: 93.6 fl (ref 78.0–100.0)
MONO ABS: 0.5 10*3/uL (ref 0.1–1.0)
Monocytes Relative: 6.1 % (ref 3.0–12.0)
NEUTROS ABS: 5.6 10*3/uL (ref 1.4–7.7)
Neutrophils Relative %: 63.4 % (ref 43.0–77.0)
PLATELETS: 280 10*3/uL (ref 150.0–400.0)
RBC: 4.58 Mil/uL (ref 3.87–5.11)
RDW: 12.5 % (ref 11.5–15.5)
WBC: 8.8 10*3/uL (ref 4.0–10.5)

## 2017-09-13 LAB — COMPREHENSIVE METABOLIC PANEL
ALBUMIN: 4.4 g/dL (ref 3.5–5.2)
ALT: 17 U/L (ref 0–35)
AST: 14 U/L (ref 0–37)
Alkaline Phosphatase: 59 U/L (ref 39–117)
BUN: 13 mg/dL (ref 6–23)
CALCIUM: 9 mg/dL (ref 8.4–10.5)
CHLORIDE: 107 meq/L (ref 96–112)
CO2: 23 meq/L (ref 19–32)
CREATININE: 0.85 mg/dL (ref 0.40–1.20)
GFR: 73.48 mL/min (ref >60.00)
Glucose, Bld: 97 mg/dL (ref 70–99)
POTASSIUM: 3.7 meq/L (ref 3.5–5.1)
SODIUM: 138 meq/L (ref 135–145)
Total Bilirubin: 0.9 mg/dL (ref 0.2–1.2)
Total Protein: 6.7 g/dL (ref 6.0–8.3)

## 2017-09-13 LAB — C-REACTIVE PROTEIN: CRP: 0.1 mg/dL — AB (ref 0.5–20.0)

## 2017-09-13 MED ORDER — GADOBENATE DIMEGLUMINE 529 MG/ML IV SOLN
12.0000 mL | Freq: Once | INTRAVENOUS | Status: AC | PRN
  Administered 2017-09-13: 12 mL via INTRAVENOUS

## 2017-09-16 LAB — VITAMIN D 25 HYDROXY (VIT D DEFICIENCY, FRACTURES): VITD: 41.73 ng/mL (ref 30.00–100.00)

## 2017-09-19 ENCOUNTER — Other Ambulatory Visit: Payer: Self-pay

## 2017-09-19 ENCOUNTER — Telehealth: Payer: Self-pay | Admitting: Gastroenterology

## 2017-09-19 MED ORDER — PREDNISONE 10 MG PO TABS: 40.0000 mg | ORAL_TABLET | Freq: Every day | ORAL | 0 refills | Status: DC

## 2017-09-19 NOTE — Telephone Encounter (Signed)
Spoke to patient, she is still having a lot of abdominal pain even if she brushes up lightly against a counter. Budesonide has not helped with symptoms. She is scheduled to come in for appointment on 09/25/17.

## 2017-09-19 NOTE — Telephone Encounter (Signed)
If she is in worsening pain and can't wait for the visit we can start her on 4m / day of prednisone. She reported this pain had been present a while, if stable she can try giving it more time with budesonide. I may increase her Humira at clinic visit pending the levels and would prefer to treat it with Humira than prednisone if possible. Thanks

## 2017-09-19 NOTE — Telephone Encounter (Signed)
V.O. From Dr. Havery Moros to start prednisone 40 mg po daily. She will keep her appointment on 10/17.

## 2017-09-19 NOTE — Telephone Encounter (Signed)
Patient has a high pain tolerance and when she touches RLQ pain goes from 4/10 to 6/10, patient is not sleeping well because she does not feel well. She does have an old Rx of prednisone 10 mg at home, does say to discard after 09/17/17.

## 2017-09-19 NOTE — Telephone Encounter (Signed)
[name] can you clarify how bad her pain is? If mild I would prefer her to continue her regimen and to see her in clinic to discuss. If she thinks she is feeling worse despite budesonide we can offer her prednisone 38m once / day until her visit. Thanks

## 2017-09-20 LAB — SERIAL MONITORING

## 2017-09-20 LAB — ADALIMUMAB+AB (SERIAL MONITOR): ADALIMUMAB DRUG LEVEL: 12 ug/mL

## 2017-09-23 ENCOUNTER — Telehealth: Payer: Self-pay | Admitting: Gastroenterology

## 2017-09-23 MED ORDER — PREDNISONE 10 MG PO TABS: 40.0000 mg | ORAL_TABLET | Freq: Every day | ORAL | 0 refills | Status: DC

## 2017-09-23 NOTE — Telephone Encounter (Signed)
Resent prednisone prescription of 09-19-17 (Originated by Almyra Free) to [facility] on [address]. Pt states [facility] did not receive script because of loss of power due to [facility].

## 2017-09-25 ENCOUNTER — Ambulatory Visit: Payer: 59 | Admitting: Gastroenterology

## 2017-09-25 ENCOUNTER — Encounter: Payer: Self-pay | Admitting: Gastroenterology

## 2017-09-25 ENCOUNTER — Telehealth: Payer: Self-pay | Admitting: Gastroenterology

## 2017-09-25 ENCOUNTER — Ambulatory Visit (INDEPENDENT_AMBULATORY_CARE_PROVIDER_SITE_OTHER): Payer: 59 | Admitting: Gastroenterology

## 2017-09-25 VITALS — BP 98/70 | HR 73 | Ht 60.0 in | Wt 138.0 lb

## 2017-09-25 DIAGNOSIS — Z23 Encounter for immunization: Secondary | ICD-10-CM

## 2017-09-25 DIAGNOSIS — K50019 Crohn's disease of small intestine with unspecified complications: Secondary | ICD-10-CM | POA: Diagnosis not present

## 2017-09-25 DIAGNOSIS — Z79899 Other long term (current) drug therapy: Secondary | ICD-10-CM | POA: Diagnosis not present

## 2017-09-25 DIAGNOSIS — G4700 Insomnia, unspecified: Secondary | ICD-10-CM

## 2017-09-25 MED ORDER — ZOLPIDEM TARTRATE 5 MG PO TABS: ORAL_TABLET | ORAL | 1 refills | Status: DC

## 2017-09-25 MED ORDER — PREDNISONE 10 MG PO TABS: ORAL_TABLET | ORAL | 0 refills | Status: DC

## 2017-09-25 NOTE — Progress Notes (Signed)
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 Wynetta Emery Colonoscopy 09/2012 - normal UGI small bowel follow through 09/2015 - edema and ulcerations in the small bowel  Currently on Humira. Over the years she has been on 6MP in the past which did not work - she was on this in 2004, she states she had an allergy to it (edema), she has been on methotrexate in the past (tried a few times a long time ago - no benefit), been on Remicade - remotely on it a few months - she did not think she got any beenfit and it stopped, enterocort / steroids, and she has been enrolled in a clinic trial for a medication at [facility] remotely but she was not sure of the drug.   She was started on Humira in April 2018.  She smokes tobacco, she has tried quitting in the past but have not been able to. She has tried multiple regimens. History of osteopenia.  IBD Health Care Maintenance: Annual Flu Vaccine - Date - flu shot declined, history of reaction? - DUE Pneumococcal Vaccine if receiving immunosuppression: - Date - February 2018 per patient Skin check / derm eval for those on biologic / thiopuriner, yearly: Date - skin exam last June DEXA scan if risk factors for osteoporosis - Date:  TB testing if on anti-TNF, yearly - Date - 02/2017  Vitamin D screening - Date - 09/13/17 - level of 41 Last Colonoscopy - Date - 10/2015 - in remission  SINCE THE LAST VISIT  Patient was given a trial of rifaximin to treat for possible SIBO, did not help too much.  Trial of low FODMAP diet and bentyl. MRE 09/13/17 - active inflammation in the ileum and jejunum while on Humira every 2 weeks Given a trial of budesonide which did not help. Transitioned to prednisone 62m / day last week.  Humira levels on 10/5 were 12 with no antibodies Hgb and CRP were normal  She reports  ongoing pain in her RLQ, she thinks this is consistent with her prior flares of Crohn's disease. Pain comes and goes, she is tender to palpation. No vomiting, she is tolerating a diet. She has fluctuating bowel habit frequency, but often loose stools. No blood in the stool. No fevers. She has gained some weight since starting prednisone. She is very emotional which is worse since being on prednisone, very tearful in clinic today. We discussed long term options given Humira has not worked well for her.     Past Medical History:  Diagnosis Date  . Anal fissure   . Anxiety   . Arthritis    -knees, hips- is bothersome now  . Colon polyps   [place]   . Delayed gastric emptying   . Depression   . GERD (gastroesophageal reflux disease)    occasional  . Graves disease    started with this  . H/O steroid therapy    presently on tapering doses of Prednisone, Entercort.  . Hypothyroidism    current  . IBS (irritable bowel syndrome)   . Osteopenia   . Vitamin B12 deficiency    remains under tx.  injections every 4 weeks-today a little more that 4 weeks.     Past Surgical History:  Procedure Laterality Date  . ABDOMINAL HYSTERECTOMY  2004   complete  .  APPENDECTOMY  2004  . BOWEL RESECTION  2004   Crohns  . COLONOSCOPY W/ POLYPECTOMY    . COLONOSCOPY WITH PROPOFOL N/A 11/01/2015   Procedure: COLONOSCOPY WITH PROPOFOL;  Surgeon: Garlan Fair, MD;  Location: [facility];  Service: Endoscopy;  Laterality: N/A;  . radioiodine therapy  2003  . TEMPOROMANDIBULAR JOINT SURGERY Left 1990  . TUBAL LIGATION  1989  . UMBILICAL HERNIA REPAIR N/A 08/06/2013   Procedure: HERNIA REPAIR UMBILICAL ADULT;  Surgeon: Odis Hollingshead, MD;  Location: [facility];  Service: General;  Laterality: N/A;  WITH MESH   Family History  Problem Relation Age of Onset  . Irritable bowel syndrome Mother   . Colon polyps Father   . Colon cancer Paternal Uncle   . Diabetes Maternal  Grandmother        type 2  . Irritable bowel syndrome Maternal Grandmother    Social History  Substance Use Topics  . Smoking status: Current Every Day Smoker    Packs/day: 0.25    Years: 2.00    Types: Cigarettes  . Smokeless tobacco: Never Used  . Alcohol use Yes     Comment: social   Current Outpatient Prescriptions  Medication Sig Dispense Refill  . B Complex-C (SUPER B COMPLEX PO) Take 1 tablet by mouth daily.    . Biotin (BIOTIN 5000) 5 MG CAPS Take 1 capsule by mouth daily.    . Cholecalciferol (VITAMIN D3) 5000 units CAPS Take 1 capsule by mouth daily.    . Coenzyme Q10 (CO Q-10) 300 MG CAPS Take 1 capsule by mouth daily.    . Cyanocobalamin 1000 MCG/ML LIQD Take 1,000 mcg by mouth daily.    . Digestive Enzymes (PAPAYA ENZYME) TABS Take 1 tablet by mouth daily.    Marland Kitchen estradiol (VIVELLE-DOT) 0.05 MG/24HR patch Place 1 patch onto the skin 2 (two) times a week.    . levothyroxine (SYNTHROID, LEVOTHROID) 100 MCG tablet Take 100 mcg by mouth daily before breakfast.    . Multiple Vitamin (MULTIVITAMIN WITH MINERALS) TABS Take 1 tablet by mouth daily.    . Omega-3 Fatty Acids (FISH OIL PO) Take 3,000 mg by mouth daily.    . predniSONE (DELTASONE) 10 MG tablet Take 4 tablets (40 mg total) by mouth daily with breakfast. 100 tablet 0  . Probiotic Product (PROBIOTIC DAILY) CAPS Take 1 capsule by mouth daily. 30 bill     No current facility-administered medications for this visit.    Allergies  Allergen Reactions  . Mercaptopurine Hives, Shortness Of Breath and Swelling  . Aspirin Adult Low [Aspirin] Hives  . Gluten Meal     Distend, abd pain, fever, chills     Review of Systems: All systems reviewed and negative except where noted in HPI.    Mr [place] Abdomen W Wo Contrast  Result Date: 09/13/2017 CLINICAL DATA:  Crohn' s disease, change in therapy, assessment for response. EXAM: MR ABDOMEN AND PELVIS WITHOUT AND WITH CONTRAST (MR ENTEROGRAPHY) TECHNIQUE: Multiplanar,  multisequence MRI of the abdomen and pelvis was performed both before and during bolus administration of intravenous contrast. Negative oral contrast VoLumen was given. CONTRAST:  66m MULTIHANCE GADOBENATE DIMEGLUMINE 529 MG/ML IV SOLN COMPARISON:  Small bowel examination of 12/07/2013 and CT abdomen from 08/14/2013 FINDINGS: Despite efforts by the technologist and patient, motion artifact is present on today's exam and could not be eliminated. This reduces exam sensitivity and specificity. COMBINED FINDINGS FOR BOTH MR ABDOMEN AND PELVIS Lower chest: Chronic scarring in the  posterior basal segment right lower lobe. Hepatobiliary: 4 mm T2 signal hyperintensity in segment 2 of the liver on image 6/7, very indistinct after contrast administration, probably a tiny hemangioma or similar benign lesion. No worrisome liver lesion is identified. Gallbladder unremarkable. Pancreas: Pancreas divisum. No abnormal pancreatic parenchymal enhancement. Spleen:  Unremarkable Adrenals/Urinary Tract:  Unremarkable Stomach/Bowel: Considerable abnormal enhancement in the distal 12 cm of the neo terminal ileum compatible with transmural inflammation in setting of Crohn' s disease. The inflamed segment of bowel is visible on images 54 through 71 of series 21 a.m. dark on arterial phase images 59 through 72 of series 17. I do not see a discrete abscess in this area. Wall thickening of the involved loop shown on image 33/13. Trace adjacent free fluid. Mildly accentuated mucosal enhancement in the sigmoid colon and rectum could reflect low-level inflammation. Several loops of jejunum appear to demonstrate accentuated enhancement on the subtraction images. Vascular/Lymphatic:  Unremarkable Reproductive: Uterus absent.  Adnexa unremarkable. Other:  No supplemental non-categorized findings. Musculoskeletal: Unremarkable IMPRESSION: 1. Considerable abnormal enhancement compatible with transmural inflammation in the distal 12 cm of the neo  terminal ileum. 2. Potential skip lesions in the jejunum, were several loops of bowel demonstrate accentuated enhancement; and along the mucosa of the sigmoid colon and rectum, although without wall thickening in the colon. 3. Pancreas divisum. Electronically Signed   By: Van Clines M.D.   On: 09/13/2017 12:16   Mr [place] Pelvis W TO Contrast  Result Date: 09/13/2017 CLINICAL DATA:  Crohn' s disease, change in therapy, assessment for response. EXAM: MR ABDOMEN AND PELVIS WITHOUT AND WITH CONTRAST (MR ENTEROGRAPHY) TECHNIQUE: Multiplanar, multisequence MRI of the abdomen and pelvis was performed both before and during bolus administration of intravenous contrast. Negative oral contrast VoLumen was given. CONTRAST:  19m MULTIHANCE GADOBENATE DIMEGLUMINE 529 MG/ML IV SOLN COMPARISON:  Small bowel examination of 12/07/2013 and CT abdomen from 08/14/2013 FINDINGS: Despite efforts by the technologist and patient, motion artifact is present on today's exam and could not be eliminated. This reduces exam sensitivity and specificity. COMBINED FINDINGS FOR BOTH MR ABDOMEN AND PELVIS Lower chest: Chronic scarring in the posterior basal segment right lower lobe. Hepatobiliary: 4 mm T2 signal hyperintensity in segment 2 of the liver on image 6/7, very indistinct after contrast administration, probably a tiny hemangioma or similar benign lesion. No worrisome liver lesion is identified. Gallbladder unremarkable. Pancreas: Pancreas divisum. No abnormal pancreatic parenchymal enhancement. Spleen:  Unremarkable Adrenals/Urinary Tract:  Unremarkable Stomach/Bowel: Considerable abnormal enhancement in the distal 12 cm of the neo terminal ileum compatible with transmural inflammation in setting of Crohn' s disease. The inflamed segment of bowel is visible on images 54 through 71 of series 21 a.m. dark on arterial phase images 59 through 72 of series 17. I do not see a discrete abscess in this area. Wall thickening of the  involved loop shown on image 33/13. Trace adjacent free fluid. Mildly accentuated mucosal enhancement in the sigmoid colon and rectum could reflect low-level inflammation. Several loops of jejunum appear to demonstrate accentuated enhancement on the subtraction images. Vascular/Lymphatic:  Unremarkable Reproductive: Uterus absent.  Adnexa unremarkable. Other:  No supplemental non-categorized findings. Musculoskeletal: Unremarkable IMPRESSION: 1. Considerable abnormal enhancement compatible with transmural inflammation in the distal 12 cm of the neo terminal ileum. 2. Potential skip lesions in the jejunum, were several loops of bowel demonstrate accentuated enhancement; and along the mucosa of the sigmoid colon and rectum, although without wall thickening in the colon. 3. Pancreas divisum. Electronically  Signed   By: Van Clines M.D.   On: 09/13/2017 12:16   Lab Results  Component Value Date   WBC 8.8 09/13/2017   HGB 14.9 09/13/2017   HCT 42.9 09/13/2017   MCV 93.6 09/13/2017   PLT 280.0 09/13/2017    Lab Results  Component Value Date   CREATININE 0.85 09/13/2017   BUN 13 09/13/2017   NA 138 09/13/2017   K 3.7 09/13/2017   CL 107 09/13/2017   CO2 23 09/13/2017    Lab Results  Component Value Date   ALT 17 09/13/2017   AST 14 09/13/2017   ALKPHOS 59 09/13/2017   BILITOT 0.9 09/13/2017   Lab Results  Component Value Date   CRP 0.1 (L) 09/13/2017     Physical Exam: BP 98/70   Pulse 73   Ht 5' (1.524 m)   Wt 138 lb (62.6 kg)   BMI 26.95 kg/m  Constitutional: Pleasant,well-developed, female in no acute distress. HEENT: Normocephalic and atraumatic. Conjunctivae are normal. No scleral icterus. Neck supple.  Cardiovascular: Normal rate, regular rhythm.  Pulmonary/chest: Effort normal and breath sounds normal. No wheezing, rales or rhonchi. Abdominal: Soft, nondistended, RLQ TTP without rebound. There are no masses palpable. No hepatomegaly. Extremities: no  edema Lymphadenopathy: No cervical adenopathy noted. Neurological: Alert and oriented to person place and time. Skin: Skin is warm and dry. No rashes noted. Psychiatric: Normal mood and affect. Behavior is normal.   ASSESSMENT AND PLAN: 56 y/o female here for reassessment for small bowel Crohn's disease. History as above - on multiple regimens without good relief. She is been on Humira most recently - she does not like taking the injections, has not thought it has provided any benefit. While her labs look okay, her MRE shows significant inflammation of the small bowel with a Humira level that should be therapeutic (12s) without antibodies. She endorses no significant benefit with Remicade in the past. 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 We have stopped Humira and now on prednisone. Will give her prednisone dosed at 38m / day for 2 weeks, and then taper by 564m/ week until done. I discussed other options with her today, namely Entyvio and Stelara. She wishes to avoid self injections if possible, and wanted to proceed with Entyvio if covered. We will reach out to her insurance to see if we can obtain coverage and start this in the next few weeks. Hopefully she responds to prednisone, also recommend low residual diet during this time. She is due for a flu shot, gave this to her today. For her insomnia while on prednisone, gave her prescription for ambien PRN.   She will be contacted once we hear back from insurance regarding Entyvio. Goal is to get her off steroids and on to a new regimen as soon as possible. She agreed with the plan.   StCarolina CellarMD [facility] Pager [phone]

## 2017-09-25 NOTE — Patient Instructions (Signed)
We have given you a prescription for Ambien and Prednisone to take to your pharmacy.  Almyra Free, Dr. Doyne Keel nurse will be in contact with you regarding starting Entyvio.  We have given you a flu shot today.   If you are age 56 or older, your body mass index should be between 23-30. Your Body mass index is 26.95 kg/m. If this is out of the aforementioned range listed, please consider follow up with your Primary Care Provider.  If you are age 21 or younger, your body mass index should be between 19-25. Your Body mass index is 26.95 kg/m. If this is out of the aformentioned range listed, please consider follow up with your Primary Care Provider.   Thank you.

## 2017-09-25 NOTE — Telephone Encounter (Signed)
Patient states that prescription from medication [name] will not be covered by the ins the way it is written. Pharmacy told pt medication needs to just say 1 at night instead of 1 or 2.

## 2017-09-26 ENCOUNTER — Other Ambulatory Visit: Payer: Self-pay

## 2017-09-26 ENCOUNTER — Telehealth: Payer: Self-pay

## 2017-09-26 DIAGNOSIS — K50919 Crohn's disease, unspecified, with unspecified complications: Secondary | ICD-10-CM

## 2017-09-26 MED ORDER — ZOLPIDEM TARTRATE 5 MG PO TABS: 5.0000 mg | ORAL_TABLET | Freq: Every evening | ORAL | 1 refills | Status: AC | PRN

## 2017-09-26 NOTE — Telephone Encounter (Signed)
Sent [facility] to [facility] [address].  Called and spoke to pt and let her know. She was very Patent attorney.

## 2017-09-26 NOTE — Telephone Encounter (Signed)
Sent ambulatory referral to start [facility] prior authorization.

## 2017-09-26 NOTE — Telephone Encounter (Signed)
-----   Message from Yetta Flock, MD sent at 09/26/2017 11:51 AM EDT ----- Regarding: Weyman Rodney request Hi Almyra Free, This patient has Crohn's and failed Humira. I'd like to start her on Entyvio. Can you start the process and see if we can get it approved? Thanks much

## 2017-10-07 ENCOUNTER — Telehealth: Payer: Self-pay | Admitting: Gastroenterology

## 2017-10-07 ENCOUNTER — Telehealth: Payer: Self-pay

## 2017-10-07 ENCOUNTER — Other Ambulatory Visit: Payer: Self-pay

## 2017-10-07 MED ORDER — VEDOLIZUMAB 300 MG IV SOLR: 300.0000 mg | INTRAVENOUS | 6 refills | Status: DC

## 2017-10-07 NOTE — Telephone Encounter (Signed)
Received confirmation from [facility] of pt's appt at 8:30am on 10-16-17 with Dr. Dossie Der. Spoke to pt.  She had to reschedule appt to Nov 14th.

## 2017-10-07 NOTE — Telephone Encounter (Signed)
Spoke to patient let her know that Donna H. had received approval from insurance. She is scheduled for appointment next week at [facility].

## 2017-10-07 NOTE — Telephone Encounter (Signed)
Faxed records and Rx for Entyvio infusion to [facility].

## 2017-10-07 NOTE — Telephone Encounter (Signed)
-----   Message from Darden Dates sent at 10/07/2017 10:07 AM EDT ----- Jena Gauss, Omega with [facility] called and said she has not received any records on this pt to schedule pt. Please fax records for pt to be seen at [facility].  They got the auth I faxed but not the records to set up the pt.  You can fax to [phone] Thank you! Amy

## 2017-10-13 ENCOUNTER — Other Ambulatory Visit: Payer: Self-pay | Admitting: Gastroenterology

## 2017-10-14 ENCOUNTER — Telehealth: Payer: Self-pay | Admitting: Gastroenterology

## 2017-10-14 NOTE — Telephone Encounter (Signed)
Patient states that she was supposed to go to her daughter's house, they have a stomach virus going through their household now: fever, vomiting and diarrhea. Advised her that since she is on prednisone, she is already not feeling her best, that it might be best to stay away until after this has run its course through the household. That was her feeling too but wanted to double check.

## 2017-11-04 ENCOUNTER — Telehealth: Payer: Self-pay | Admitting: Gastroenterology

## 2017-11-04 NOTE — Telephone Encounter (Signed)
Dr. Havery Moros, pt is requesting a prescription for synthroid.  [name] takes 100 mcg daily before breakfast.  [name] indicates that Dr. Wynetta Emery previously filled this for her. Please advise. Thank you

## 2017-11-05 ENCOUNTER — Telehealth: Payer: Self-pay | Admitting: Gastroenterology

## 2017-11-05 ENCOUNTER — Other Ambulatory Visit: Payer: Self-pay

## 2017-11-05 MED ORDER — LEVOTHYROXINE SODIUM 100 MCG PO TABS: 100.0000 ug | ORAL_TABLET | Freq: Every day | ORAL | 0 refills | Status: DC

## 2017-11-05 NOTE — Telephone Encounter (Signed)
Jan we can give her a 90 day supply of 154mg of synthroid but this needs to come from primary care. She needs to establish a new primary care if she doesn't yet have one. Thanks.  I don't prescribe this medication routinely.

## 2017-11-05 NOTE — Progress Notes (Signed)
Script sent  

## 2017-11-05 NOTE — Telephone Encounter (Signed)
Patient understands that if infection is not better to call our office and we would then delay the Entyvio infusion by a week.

## 2017-11-05 NOTE — Telephone Encounter (Signed)
[name] can you clarify how much prednisone she is taking and when she was last infused with Entyvio and when she is due again? She is on a long taper of prednisone, I would not want to stop it. We may consider delaying Entyvio infusion depending on when she is scheduled to get it. Thanks

## 2017-11-05 NOTE — Telephone Encounter (Signed)
Spoke to patient, she currently is on prednisone 20 mg daily, on 11/30 she will go down to 15 mg/day. Last Entyvio infusion was on 11/19 and next one is scheduled for 12/3. She is going to pick up her 10 day course of amoxicillin today for infection at dental crown site.

## 2017-11-05 NOTE — Telephone Encounter (Signed)
Okay.  She should continue prednisone. If infection is controlled at the time of her next infusion, I would continue with Entyvio. If her infection is not getting better and remains active, may need to delay Entyvio by a week. The drug is in her system for several weeks so don't think it makes a major difference if we hold her next dose, would only do so if infection is not controlled. Thanks

## 2017-11-05 NOTE — Telephone Encounter (Signed)
Routed to Dr. Armbruster. 

## 2017-12-19 ENCOUNTER — Other Ambulatory Visit: Payer: Self-pay | Admitting: Gastroenterology

## 2017-12-19 NOTE — Telephone Encounter (Signed)
[name] did the patient call requesting this or the pharmacy? She was supposed to taper and be off this at present time. She should be on Entyvio. Otherwise she is due for a clinic follow up if you can help schedule. thanks

## 2017-12-19 NOTE — Telephone Encounter (Signed)
OK to refill? If so, please let me know how much. Thanks

## 2017-12-20 NOTE — Telephone Encounter (Signed)
Okay; thanks.

## 2017-12-20 NOTE — Telephone Encounter (Signed)
Called Pt. She is not requesting prednisone, [facility]. She is on Entyvio ("reluctantly" she said). We scheduled her for a follow up appt on Monday morning, 12-23-17.

## 2017-12-23 ENCOUNTER — Ambulatory Visit (INDEPENDENT_AMBULATORY_CARE_PROVIDER_SITE_OTHER)
Admission: RE | Admit: 2017-12-23 | Discharge: 2017-12-23 | Disposition: A | Discharge status: Home / Self Care | Payer: 59 | Source: Ambulatory Visit | Attending: Gastroenterology | Admitting: Gastroenterology

## 2017-12-23 ENCOUNTER — Encounter: Payer: Self-pay | Admitting: Gastroenterology

## 2017-12-23 ENCOUNTER — Other Ambulatory Visit (INDEPENDENT_AMBULATORY_CARE_PROVIDER_SITE_OTHER): Payer: 59

## 2017-12-23 ENCOUNTER — Ambulatory Visit (INDEPENDENT_AMBULATORY_CARE_PROVIDER_SITE_OTHER): Payer: 59 | Admitting: Gastroenterology

## 2017-12-23 VITALS — BP 100/70 | HR 84 | Ht 60.0 in | Wt 141.5 lb

## 2017-12-23 DIAGNOSIS — K50919 Crohn's disease, unspecified, with unspecified complications: Secondary | ICD-10-CM

## 2017-12-23 DIAGNOSIS — Z79899 Other long term (current) drug therapy: Secondary | ICD-10-CM

## 2017-12-23 DIAGNOSIS — R05 Cough: Secondary | ICD-10-CM

## 2017-12-23 DIAGNOSIS — J3489 Other specified disorders of nose and nasal sinuses: Secondary | ICD-10-CM | POA: Diagnosis not present

## 2017-12-23 DIAGNOSIS — R059 Cough, unspecified: Secondary | ICD-10-CM

## 2017-12-23 LAB — COMPREHENSIVE METABOLIC PANEL
ALT: 27 U/L (ref 0–35)
AST: 25 U/L (ref 0–37)
Albumin: 4.5 g/dL (ref 3.5–5.2)
Alkaline Phosphatase: 58 U/L (ref 39–117)
BUN: 8 mg/dL (ref 6–23)
CHLORIDE: 107 meq/L (ref 96–112)
CO2: 25 meq/L (ref 19–32)
CREATININE: 0.71 mg/dL (ref 0.40–1.20)
Calcium: 9.1 mg/dL (ref 8.4–10.5)
GFR: 90.35 mL/min (ref >60.00)
Glucose, Bld: 92 mg/dL (ref 70–99)
Potassium: 3.3 mEq/L — ABNORMAL LOW (ref 3.5–5.1)
SODIUM: 140 meq/L (ref 135–145)
Total Bilirubin: 0.8 mg/dL (ref 0.2–1.2)
Total Protein: 7.3 g/dL (ref 6.0–8.3)

## 2017-12-23 LAB — CBC WITH DIFFERENTIAL/PLATELET
BASOS PCT: 0.8 % (ref 0.0–3.0)
Basophils Absolute: 0.1 10*3/uL (ref 0.0–0.1)
EOS ABS: 0.1 10*3/uL (ref 0.0–0.7)
Eosinophils Relative: 1.7 % (ref 0.0–5.0)
HCT: 43.6 % (ref 36.0–46.0)
Hemoglobin: 15 g/dL (ref 12.0–15.0)
LYMPHS ABS: 3.3 10*3/uL (ref 0.7–4.0)
Lymphocytes Relative: 38.4 % (ref 12.0–46.0)
MCHC: 34.4 g/dL (ref 30.0–36.0)
MCV: 93.5 fl (ref 78.0–100.0)
MONO ABS: 0.6 10*3/uL (ref 0.1–1.0)
Monocytes Relative: 6.5 % (ref 3.0–12.0)
NEUTROS ABS: 4.5 10*3/uL (ref 1.4–7.7)
NEUTROS PCT: 52.6 % (ref 43.0–77.0)
PLATELETS: 317 10*3/uL (ref 150.0–400.0)
RBC: 4.66 Mil/uL (ref 3.87–5.11)
RDW: 12.6 % (ref 11.5–15.5)
WBC: 8.6 10*3/uL (ref 4.0–10.5)

## 2017-12-23 MED ORDER — IPRATROPIUM BROMIDE 0.03 % NA SOLN: 2.0000 | Freq: Four times a day (QID) | NASAL | 1 refills | Status: DC | PRN

## 2017-12-23 NOTE — Progress Notes (Signed)
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 Wynetta Emery Colonoscopy 09/2012 - normal UGI small bowel follow through 09/2015 - edema and ulcerations in the small bowel  Over the years she has been on 6MP in the past which did not work - she was on this in 2004, she states she had an allergy to it (edema), she has been on methotrexate in the past (tried a few times a long time ago - no benefit), been on Remicade - remotely on it a few months - she did not think she got any beenfit and it stopped, enterocort / steroids, and she has been enrolled in a clinic trial for a medication at [facility] remotely but she was not sure of the drug.   She was started on Humira in April 2018 and stopped in November with active inflammation on MRE despite adequate Humira levels, she continued to feel poorly. She smokes tobacco, she has tried quitting in the past but have not been able to. She has tried multiple regimens. History of osteopenia.  IBD Health Care Maintenance: Annual Flu Vaccine- Date - flu shot declined, history of reaction? - UTD per patient Pneumococcal Vaccineif receiving immunosuppression: - Date - February 2018 per patient TB testingif on anti-TNF, yearly - Date - 02/2017  Vitamin Dscreening - Date - 09/13/17 - level of 41 Last Colonoscopy - Date - 10/2015 - in remission  SINCE THE LAST VISIT  Patient was given a trial of rifaximin to treat for possible SIBO, did not help too much.  Trial of low FODMAP diet and bentyl. Did not help too much MRE 09/13/17 - active inflammation in the ileum and jejunum while on Humira every 2 weeks Given a trial of budesonide which did not help. Transitioned to prednisone 24m / day last week.  Humira levels on 10/5 were 12 with no antibodies Hgb and CRP were normal  Humira was discontinued and she  was placed on a prednisone taper. We transitioned her to [facility] she started in late November. She's had 2 doses of Entyvio. Unfortunately she states she is not tolerating this very well at all. She states she has had ongoing issues with her sinuses, throat, and lungs. She endorses sinus infections for which she needed antibiotics in November. She then developed strep throat was on Antibiotics again in December. Now she has continued rhinorrhea along with postnasal drip and ongoing cough. She is taking Mucinex, Zyrtec, Flonase, and Tylenol as needed. She has some cough with yellow mucus. She also endorses headaches and joint pains which are bothering her significantly since starting entyvio.   Following 2 doses of Enyvio she states her Crohn's is no better, or no worse. She is averaging 5-8 bowel movement per day, not much blood in the stools. Usually loose stools. She has abdominal tenderness with bowel movements and coughing. And endorses some pressure in her abdomen. She came off all steroids around Christmas time. She did not like the way she felt on steroids and gained about 30 pounds.   Overall she is not feeling well and is quite frustrated by her course in recent weeks.   Past Medical History:  Diagnosis Date  . Anal fissure   . Anxiety   . Arthritis    -knees, hips- is bothersome now  . Colon polyps   . Crohn disease ([place]  10-27-15   . Delayed gastric emptying   . Depression   . GERD (gastroesophageal reflux disease)    occasional  . Graves disease    started with this  . H/O steroid therapy    presently on tapering doses of Prednisone, Entercort.  . Hypothyroidism    current  . IBS (irritable bowel syndrome)   . Osteopenia   . Vitamin B12 deficiency    remains under tx.  injections every 4 weeks-today a little more that 4 weeks.     Past Surgical History:  Procedure Laterality Date  . ABDOMINAL HYSTERECTOMY  2004   complete  . APPENDECTOMY  2004  . BOWEL  RESECTION  2004   Crohns  . COLONOSCOPY W/ POLYPECTOMY    . COLONOSCOPY WITH PROPOFOL N/A 11/01/2015   Procedure: COLONOSCOPY WITH PROPOFOL;  Surgeon: Garlan Fair, MD;  Location: [facility];  Service: Endoscopy;  Laterality: N/A;  . radioiodine therapy  2003  . TEMPOROMANDIBULAR JOINT SURGERY Left 1990  . TUBAL LIGATION  1989  . UMBILICAL HERNIA REPAIR N/A 08/06/2013   Procedure: HERNIA REPAIR UMBILICAL ADULT;  Surgeon: Odis Hollingshead, MD;  Location: [facility];  Service: General;  Laterality: N/A;  WITH MESH   Family History  Problem Relation Age of Onset  . Irritable bowel syndrome Mother   . Colon polyps Father   . Colon cancer Paternal Uncle   . Diabetes Maternal Grandmother        type 2  . Irritable bowel syndrome Maternal Grandmother    Social History   Tobacco Use  . Smoking status: Current Every Day Smoker    Packs/day: 0.25    Years: 2.00    Pack years: 0.50    Types: Cigarettes  . Smokeless tobacco: Never Used  Substance Use Topics  . Alcohol use: Yes    Comment: social  . Drug use: No   Current Outpatient Medications  Medication Sig Dispense Refill  . B Complex-C (SUPER B COMPLEX PO) Take 1 tablet by mouth daily.    . Biotin (BIOTIN 5000) 5 MG CAPS Take 1 capsule by mouth daily.    . cetirizine (ZYRTEC) 10 MG chewable tablet Chew 10 mg by mouth daily.    . Cholecalciferol (VITAMIN D3) 5000 units CAPS Take 1 capsule by mouth daily.    . Coenzyme Q10 (CO Q-10) 300 MG CAPS Take 1 capsule by mouth daily.    . Cyanocobalamin 1000 MCG/ML LIQD Take 1,000 mcg by mouth daily.    . Digestive Enzymes (PAPAYA ENZYME) TABS Take 1 tablet by mouth daily.    Marland Kitchen estradiol (VIVELLE-DOT) 0.05 MG/24HR patch Place 1 patch onto the skin 2 (two) times a week.    . fluticasone (FLONASE) 50 MCG/ACT nasal spray Place 1 spray into both nostrils daily.    . GuaiFENesin (MUCINEX PO) Take 1 tablet by mouth 2 (two) times daily.    Marland Kitchen levothyroxine (SYNTHROID, LEVOTHROID) 100 MCG tablet  Take 1 tablet (100 mcg total) by mouth daily before breakfast. 90 tablet 0  . Multiple Vitamin (MULTIVITAMIN WITH MINERALS) TABS Take 1 tablet by mouth daily.    . Omega-3 Fatty Acids (FISH OIL PO) Take 3,000 mg by mouth daily.    . Probiotic Product (PROBIOTIC DAILY) CAPS Take 1 capsule by mouth daily. 30 bill    . sodium chloride 0.9 % SOLN 250 mL with vedolizumab 300 MG SOLR 300 mg Inject 300 mg into the vein as directed. Needs induction dose: Week 0, 2,6 then  every 8 weeks thereafter. 300 mg 6  . zolpidem (AMBIEN) 5 MG tablet Take 1 tablet (5 mg total) by mouth at bedtime as needed for sleep. 60 tablet 1   No current facility-administered medications for this visit.    Allergies  Allergen Reactions  . Mercaptopurine Hives, Shortness Of Breath and Swelling  . Aspirin Adult Low [Aspirin] Hives  . Gluten Meal     Distend, abd pain, fever, chills     Review of Systems: All systems reviewed and negative except where noted in HPI.   Lab Results  Component Value Date   WBC 8.6 12/23/2017   HGB 15.0 12/23/2017   HCT 43.6 12/23/2017   MCV 93.5 12/23/2017   PLT 317.0 12/23/2017    Lab Results  Component Value Date   CREATININE 0.71 12/23/2017   BUN 8 12/23/2017   NA 140 12/23/2017   K 3.3 (L) 12/23/2017   CL 107 12/23/2017   CO2 25 12/23/2017    Lab Results  Component Value Date   ALT 27 12/23/2017   AST 25 12/23/2017   ALKPHOS 58 12/23/2017   BILITOT 0.8 12/23/2017      Physical Exam: BP 100/70 (BP Location: Left Arm, Patient Position: Sitting, Cuff Size: Normal)   Pulse 84   Ht 5' (1.524 m)   Wt 141 lb 8 oz (64.2 kg)   BMI 27.63 kg/m  Constitutional: Pleasant, female in no acute distress. HEENT: Normocephalic and atraumatic. Conjunctivae are normal. No scleral icterus. Neck supple.  Cardiovascular: Normal rate, regular rhythm.  Pulmonary/chest: Effort normal and breath sounds normal. Congested bilaterally with some rhonchi Abdominal: Soft, nondistended,  diffuse mild tenderness to palpation  There are no masses palpable. No hepatomegaly. Extremities: no edema Lymphadenopathy: No cervical adenopathy noted. Neurological: Alert and oriented to person place and time. Skin: Skin is warm and dry. No rashes noted. Psychiatric: Normal mood and affect. Behavior is normal.   ASSESSMENT AND PLAN: 57 year old female here for follow-up for small bowel Crohn's disease. History as outlined above - on multiple regimens to date, none of which have provided any benefit. 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   Since our last visit we transitioned her from Humira to [facility] through a prednisone taper. Following 2 doses of Entyvio, she unfortunately is having severe side effects to include rhinorrhea / sinusitis, headaches, joint pains - the most common side effects of Entyvio. Unfortunately it seems to be taking quite a bit of time for her to recover from her last infusion. I discussed options with her. We had previously chosen Entyvio due to its safety profile, and that she would not need to give herself injections which she wanted to avoid. I'm not sure if she is going to be tolerate this moving forward due to side effects however. I will give her Atrovent nasal spray to minimize her rhinorrhea and see if this helps. I'll obtain a chest x-ray today to ensure negative, and basic blood work obtained today which appears normal. If she worsens she should be tested for influenza (will need to see primary care about that since we don't have the testing for it here).   Will relay results of labs and CXR to her and monitor for another week. She is already 1 week overdue for next Entyvio infusion. I asked her to contact me in one  week to see how she is doing. If she does not recover to have another dose of Entyvio  or thinks she cannot tolerate it, we may have to stop it and look into another regimen, such as Stelara.   She agreed with the plan.   [place] Cellar, MD [facility] Pager [phone]

## 2017-12-23 NOTE — Patient Instructions (Addendum)
If you are age 57 or older, your body mass index should be between 23-30. Your Body mass index is 27.63 kg/m. If this is out of the aforementioned range listed, please consider follow up with your Primary Care Provider.  If you are age 36 or younger, your body mass index should be between 19-25. Your Body mass index is 27.63 kg/m. If this is out of the aformentioned range listed, please consider follow up with your Primary Care Provider.   We have sent the following medications to your pharmacy for you to pick up at your convenience: Atrovent Nasal Spray  Please go Radiology in the basement of our building to have a Chest Xray done as you leave today. Please go to the lab in the basement of our building to have lab work done as you leave today.  Thank you for entrusting me with your care and for [facility], Dr. [place] Cellar

## 2018-01-01 ENCOUNTER — Telehealth: Payer: Self-pay | Admitting: Gastroenterology

## 2018-01-01 NOTE — Telephone Encounter (Signed)
Patient will continue with current treatment and call back in one week.

## 2018-01-01 NOTE — Telephone Encounter (Signed)
Called [facility] her last infusion was 11/15/17, that was 2nd dose. She was due on 12/16/17 but cancelled due to illness.

## 2018-01-01 NOTE — Telephone Encounter (Signed)
Patient states she is calling to give an update on her symptoms. Pt states she is still congested in her head and chest and wants to know what to do about her infusion.

## 2018-01-01 NOTE — Telephone Encounter (Signed)
[name] can you clarify when she is next due for her Entyvio? If she is not better and continuing to have these symptoms, then I think we need to hold Entyvio until she recovers from this. When she does recover, will need to discuss whether or not we do another infusion. I am inclined to say we may need to stop this therapy and consider another regimen. For now, let's hold off. I want to hear from her in one week. If her Crohns symptoms worsen in the interim, we can use steroids but want to hold off on those if possible, as she has had side effects from those as well.

## 2018-01-01 NOTE — Telephone Encounter (Signed)
Spoke to patient she is continuing to take Mucinex chest congestion, Atrovent and still is not any better. Nasal congestion runs from clear to yellowy/green then back to clear.

## 2018-01-01 NOTE — Telephone Encounter (Signed)
Okay, I would continue to hold off until she is feeling better. I don't think she will want to resume it. Can you let her know to hold off for now, I would like her to call me back in one week. Thanks

## 2018-01-06 ENCOUNTER — Telehealth: Payer: Self-pay

## 2018-01-06 DIAGNOSIS — R1084 Generalized abdominal pain: Secondary | ICD-10-CM

## 2018-01-06 NOTE — Telephone Encounter (Signed)
Patient notified of CT date and time for 01/15/18 9:30.  She understands to come pick up contrast and instructions.  Almyra Free please see need for Stelara

## 2018-01-06 NOTE — Telephone Encounter (Signed)
See the questions from the patient

## 2018-01-06 NOTE — Telephone Encounter (Signed)
Called patient. She is feeling much better from the rhinorrhea sinus congestion. I do think she had severe symptoms related to [facility], her symptoms and time course match up well. She states she would prefer to have Crohn's symptoms rather than side effects of Entyvio, in this light, we will stop Entyvio given risks for recurrent issues with this. We discussed other options, namely Stelara, and I think that would be her best option at this point. We will reach out to her insurance to see if we can get approval for that.   She otherwise relates severe abdominal pain that hurts with coughing. She had a hernia repair in the past and that was her presenting symptom. I did not appreciate it on exam previously, it's possible she has severe musculoskeletal strain from all of the coughing she had related to URI issues, but will evaluate with CT scan of the abdomen / pelvis with contrast, rule out hernia.   Finally, she states she has cut back on smoking cigarettes but having difficulty quitting. I discussed Chantix with her, I think it is reasonable, she wants to think about it. Smoking is her big risk factor for making her Crohn's worse.    Sherri, Can you help look into her insurance to see if we can get Stelara approved.  Otherwise can you also help coordinate CT abdomen / pelvis with contrast, rule out hernia, rule out active Crohn's. Thanks

## 2018-01-06 NOTE — Telephone Encounter (Signed)
Per pt she is feeling much better, but still has some congestion. She would like know if she should have an infusion scheduled. She is also having some soreness around the area that she had her hernia repair and would like to know if Dr. Havery Moros would like that checked.

## 2018-01-08 NOTE — Telephone Encounter (Signed)
Faxed prior auth to [facility] requesting to start Stelara.

## 2018-01-15 ENCOUNTER — Ambulatory Visit (INDEPENDENT_AMBULATORY_CARE_PROVIDER_SITE_OTHER)
Admission: RE | Admit: 2018-01-15 | Discharge: 2018-01-15 | Disposition: A | Discharge status: Home / Self Care | Payer: 59 | Source: Ambulatory Visit | Attending: Gastroenterology | Admitting: Gastroenterology

## 2018-01-15 DIAGNOSIS — R1084 Generalized abdominal pain: Secondary | ICD-10-CM | POA: Diagnosis not present

## 2018-01-15 MED ORDER — IOPAMIDOL (ISOVUE-300) INJECTION 61%
100.0000 mL | Freq: Once | INTRAVENOUS | Status: AC | PRN
  Administered 2018-01-15: 100 mL via INTRAVENOUS

## 2018-01-16 ENCOUNTER — Other Ambulatory Visit: Payer: Self-pay

## 2018-01-16 MED ORDER — USTEKINUMAB 130 MG/26ML IV SOLN: 390.0000 mg | Freq: Once | INTRAVENOUS | 0 refills | Status: AC

## 2018-01-20 ENCOUNTER — Telehealth: Payer: Self-pay | Admitting: Gastroenterology

## 2018-01-20 ENCOUNTER — Other Ambulatory Visit: Payer: Self-pay

## 2018-01-20 DIAGNOSIS — K50919 Crohn's disease, unspecified, with unspecified complications: Secondary | ICD-10-CM

## 2018-01-20 NOTE — Telephone Encounter (Signed)
See email message.

## 2018-01-21 ENCOUNTER — Other Ambulatory Visit: Payer: 59

## 2018-01-21 DIAGNOSIS — K50919 Crohn's disease, unspecified, with unspecified complications: Secondary | ICD-10-CM

## 2018-01-23 LAB — HEPATITIS B SURFACE ANTIGEN: Hepatitis B Surface Ag: NONREACTIVE

## 2018-01-23 LAB — QUANTIFERON-TB GOLD PLUS
NIL: 1.44 [IU]/mL
QUANTIFERON-TB GOLD PLUS: POSITIVE — AB
TB2-NIL: 0.53 IU/mL

## 2018-01-23 LAB — HEPATITIS C ANTIBODY
Hepatitis C Ab: NONREACTIVE
SIGNAL TO CUT-OFF: 0.01 (ref <1.00)

## 2018-01-28 ENCOUNTER — Ambulatory Visit (INDEPENDENT_AMBULATORY_CARE_PROVIDER_SITE_OTHER): Payer: 59 | Admitting: Gastroenterology

## 2018-01-28 DIAGNOSIS — K529 Noninfective gastroenteritis and colitis, unspecified: Secondary | ICD-10-CM | POA: Diagnosis not present

## 2018-01-28 NOTE — Progress Notes (Signed)
Administered TB skin test. Pt tolerated well and knows to return on Friday to have site checked.

## 2018-01-31 LAB — TB SKIN TEST: TB SKIN TEST: NEGATIVE

## 2018-01-31 NOTE — Addendum Note (Signed)
Addended by: Roetta Sessions on: 01/31/2018 09:50 AM   Modules accepted: Orders

## 2018-02-04 ENCOUNTER — Other Ambulatory Visit: Payer: Self-pay

## 2018-02-04 DIAGNOSIS — R7612 Nonspecific reaction to cell mediated immunity measurement of gamma interferon antigen response without active tuberculosis: Secondary | ICD-10-CM

## 2018-02-05 ENCOUNTER — Other Ambulatory Visit: Payer: 59

## 2018-02-05 ENCOUNTER — Encounter: Payer: Self-pay | Admitting: Gastroenterology

## 2018-02-05 DIAGNOSIS — R7612 Nonspecific reaction to cell mediated immunity measurement of gamma interferon antigen response without active tuberculosis: Secondary | ICD-10-CM

## 2018-02-05 NOTE — Addendum Note (Signed)
Addended by: Sallye Ober on: 02/05/2018 09:54 AM   Modules accepted: Orders

## 2018-02-07 LAB — QUANTIFERON-TB GOLD PLUS
NIL: 0.69 [IU]/mL
QuantiFERON-TB Gold Plus: NEGATIVE
TB1-NIL: <0 IU/mL

## 2018-02-12 ENCOUNTER — Telehealth: Payer: Self-pay | Admitting: Gastroenterology

## 2018-02-12 NOTE — Telephone Encounter (Signed)
Spoke to [facility], patient had contacted them to do start the Stelara infusion and send her the infusion. Patient apparently did not understand instructions, as this was to be done through [facility]. They were able to stop the shipment from going to the patient's house, all this had already been arranged to go to [place]. Expecting a call back from [facility].

## 2018-02-12 NOTE — Telephone Encounter (Signed)
Elizabeth @ [facility] calling about patients medication  [phone]

## 2018-02-12 NOTE — Telephone Encounter (Signed)
Called Elizabeth back at [facility], left message to call back after lunch.

## 2018-02-20 ENCOUNTER — Telehealth: Payer: Self-pay

## 2018-02-20 NOTE — Telephone Encounter (Signed)
Left message for wanting to know if she has started her Stelara infusion.

## 2018-02-27 ENCOUNTER — Encounter: Payer: Self-pay | Admitting: Gastroenterology

## 2018-02-28 ENCOUNTER — Telehealth: Payer: Self-pay

## 2018-02-28 NOTE — Telephone Encounter (Signed)
Faxed prior authorization for Stelara injections to [facility], also faxed [place] Nurse Navigator form. Patient had her Stelara IV injection on 02/26/18. She will need the [place] injections every 8 weeks, due on 04/23/18.

## 2018-03-05 ENCOUNTER — Other Ambulatory Visit: Payer: Self-pay | Admitting: Internal Medicine

## 2018-03-05 ENCOUNTER — Ambulatory Visit
Admission: RE | Admit: 2018-03-05 | Discharge: 2018-03-05 | Disposition: A | Discharge status: Home / Self Care | Payer: 59 | Source: Ambulatory Visit | Attending: Internal Medicine | Admitting: Internal Medicine

## 2018-03-05 DIAGNOSIS — R059 Cough, unspecified: Secondary | ICD-10-CM

## 2018-03-05 DIAGNOSIS — R05 Cough: Secondary | ICD-10-CM

## 2018-03-12 ENCOUNTER — Encounter: Payer: Self-pay | Admitting: Gastroenterology

## 2018-03-13 ENCOUNTER — Telehealth: Payer: Self-pay

## 2018-03-13 NOTE — Telephone Encounter (Signed)
Faxed information to nurse navigator to enroll patient.

## 2018-05-01 ENCOUNTER — Telehealth: Payer: Self-pay | Admitting: Gastroenterology

## 2018-05-01 NOTE — Telephone Encounter (Signed)
Patient had her Stelara IV in March, she had her first injection last Wednesday. Since starting the Stelara she has noticed a marked increase of fatigue, joint pain especially in her hands/feet. On average she is still having 7-8 bowel movements a day. Patient is also very distressed about weight gain over past year of 35 lbs.

## 2018-05-01 NOTE — Telephone Encounter (Signed)
[name] I think we should obtain baseline labs, CBC and CMET to ensure stable. If she thinks diarrhea is worsening she should have stool for C Diff PCR. Will await this result. I think prior weight gain was due to steroid use, and hopefully this gets better with time as she gets farther out from her steroid use. Does she have a follow up with me? If not, can you book one with me or APP? Thanks

## 2018-05-01 NOTE — Telephone Encounter (Signed)
Left message to call back  

## 2018-05-02 ENCOUNTER — Other Ambulatory Visit (INDEPENDENT_AMBULATORY_CARE_PROVIDER_SITE_OTHER): Payer: 59

## 2018-05-02 ENCOUNTER — Other Ambulatory Visit: Payer: Self-pay

## 2018-05-02 ENCOUNTER — Encounter: Payer: Self-pay | Admitting: Gastroenterology

## 2018-05-02 DIAGNOSIS — M25579 Pain in unspecified ankle and joints of unspecified foot: Secondary | ICD-10-CM

## 2018-05-02 DIAGNOSIS — K50919 Crohn's disease, unspecified, with unspecified complications: Secondary | ICD-10-CM

## 2018-05-02 DIAGNOSIS — R5383 Other fatigue: Secondary | ICD-10-CM

## 2018-05-02 LAB — CBC WITH DIFFERENTIAL/PLATELET
BASOS ABS: 0.1 10*3/uL (ref 0.0–0.1)
Basophils Relative: 0.9 % (ref 0.0–3.0)
EOS ABS: 0.2 10*3/uL (ref 0.0–0.7)
EOS PCT: 2.6 % (ref 0.0–5.0)
HCT: 42.9 % (ref 36.0–46.0)
HEMOGLOBIN: 14.9 g/dL (ref 12.0–15.0)
LYMPHS ABS: 2 10*3/uL (ref 0.7–4.0)
Lymphocytes Relative: 32.6 % (ref 12.0–46.0)
MCHC: 34.6 g/dL (ref 30.0–36.0)
MCV: 90.6 fl (ref 78.0–100.0)
MONO ABS: 0.4 10*3/uL (ref 0.1–1.0)
Monocytes Relative: 6.2 % (ref 3.0–12.0)
NEUTROS PCT: 57.7 % (ref 43.0–77.0)
Neutro Abs: 3.5 10*3/uL (ref 1.4–7.7)
Platelets: 296 10*3/uL (ref 150.0–400.0)
RBC: 4.74 Mil/uL (ref 3.87–5.11)
RDW: 13.5 % (ref 11.5–15.5)
WBC: 6.1 10*3/uL (ref 4.0–10.5)

## 2018-05-02 LAB — COMPREHENSIVE METABOLIC PANEL
ALBUMIN: 4.3 g/dL (ref 3.5–5.2)
ALK PHOS: 62 U/L (ref 39–117)
ALT: 15 U/L (ref 0–35)
AST: 15 U/L (ref 0–37)
BILIRUBIN TOTAL: 1.2 mg/dL (ref 0.2–1.2)
BUN: 10 mg/dL (ref 6–23)
CO2: 25 mEq/L (ref 19–32)
CREATININE: 0.82 mg/dL (ref 0.40–1.20)
Calcium: 9.2 mg/dL (ref 8.4–10.5)
Chloride: 106 mEq/L (ref 96–112)
GFR: 76.42 mL/min (ref >60.00)
GLUCOSE: 108 mg/dL — AB (ref 70–99)
POTASSIUM: 3.5 meq/L (ref 3.5–5.1)
SODIUM: 139 meq/L (ref 135–145)
TOTAL PROTEIN: 6.8 g/dL (ref 6.0–8.3)

## 2018-05-02 NOTE — Telephone Encounter (Signed)
Patient just had a recent C diff test at her PCP. Will come get labs done this morning and has a follow up office visit on 5/29 with APP.

## 2018-05-07 ENCOUNTER — Encounter: Payer: Self-pay | Admitting: Gastroenterology

## 2018-05-07 ENCOUNTER — Ambulatory Visit (INDEPENDENT_AMBULATORY_CARE_PROVIDER_SITE_OTHER): Payer: 59 | Admitting: Gastroenterology

## 2018-05-07 VITALS — BP 110/66 | HR 76 | Ht 60.0 in | Wt 148.6 lb

## 2018-05-07 DIAGNOSIS — K5000 Crohn's disease of small intestine without complications: Secondary | ICD-10-CM | POA: Diagnosis not present

## 2018-05-07 MED ORDER — PREDNISONE 10 MG PO TABS: 20.0000 mg | ORAL_TABLET | Freq: Every day | ORAL | 0 refills | Status: DC

## 2018-05-07 NOTE — Progress Notes (Signed)
05/07/2018 [name] [id number] Apr 27, 1961   HISTORY OF PRESENT ILLNESS:  This is a patient of Dr. Doyne Keel who has long-standing small bowel Crohn's disease.  Please see his note from 12/23/2017 that summarizes her history well.  Anyway, she just started Stelara, had her infusion on 3/20 and has done one injection at home on 5/15.  Comes in today with complaints of severe fatigue, joint pain, abdominal bloating/distention, weight gain of about 35 pounds (has been off of prednisone since December and weight never came off), and diarrhea.  Keeps track of number of BM's per month and for the month of May it was about 160 MB's.  We had requested a Cdiff stool study but she said that she had one recently by her PCP (that was actually done at 4/8 and was negative according to her report and she says that she knows it is not Cdiff).  She is tearful and frustrated, wanting to know what the long-term plan is going to be.     Past Medical History:  Diagnosis Date  . Anal fissure   . Anxiety   . Arthritis    -knees, hips- is bothersome now  . Colon polyps   . Crohn disease ([name])    10-27-15   . Delayed gastric emptying   . Depression   . GERD (gastroesophageal reflux disease)    occasional  . Graves disease    started with this  . H/O steroid therapy    presently on tapering doses of Prednisone, Entercort.  . Hypothyroidism    current  . IBS (irritable bowel syndrome)   . Osteopenia   . Vitamin B12 deficiency    remains under tx.  injections every 4 weeks-today a little more that 4 weeks.   Past Surgical History:  Procedure Laterality Date  . ABDOMINAL HYSTERECTOMY  2004   complete  . APPENDECTOMY  2004  . BOWEL RESECTION  2004   Crohns  . COLONOSCOPY W/ POLYPECTOMY    . COLONOSCOPY WITH PROPOFOL N/A 11/01/2015   Procedure: COLONOSCOPY WITH PROPOFOL;  Surgeon: Garlan Fair, MD;  Location: [facility];  Service: Endoscopy;  Laterality: N/A;  . radioiodine therapy   2003  . TEMPOROMANDIBULAR JOINT SURGERY Left 1990  . TUBAL LIGATION  1989  . UMBILICAL HERNIA REPAIR N/A 08/06/2013   Procedure: HERNIA REPAIR UMBILICAL ADULT;  Surgeon: Odis Hollingshead, MD;  Location: [facility];  Service: General;  Laterality: N/A;  WITH MESH    reports that she has been smoking cigarettes.  She has a 0.50 pack-year smoking history. She has never used smokeless tobacco. She reports that she drinks alcohol. She reports that she does not use drugs. family history includes Colon cancer in her paternal uncle; Colon polyps in her father; Diabetes in her maternal grandmother; Irritable bowel syndrome in her maternal grandmother and mother. Allergies  Allergen Reactions  . Mercaptopurine Hives, Shortness Of Breath and Swelling  . Aspirin Adult Low [Aspirin] Hives  . Gluten Meal     Distend, abd pain, fever, chills      Outpatient Encounter Medications as of 05/07/2018  Medication Sig  . B Complex-C (SUPER B COMPLEX PO) Take 1 tablet by mouth daily.  . Biotin (BIOTIN 5000) 5 MG CAPS Take 1 capsule by mouth daily.  . cetirizine (ZYRTEC) 10 MG chewable tablet Chew 10 mg by mouth daily.  . Cholecalciferol (VITAMIN D3) 5000 units CAPS Take 1 capsule by mouth daily.  . Coenzyme Q10 (CO Q-10)  300 MG CAPS Take 1 capsule by mouth daily.  . Cyanocobalamin 1000 MCG/ML LIQD Take 1,000 mcg by mouth daily.  . Digestive Enzymes (PAPAYA ENZYME) TABS Take 1 tablet by mouth daily.  Marland Kitchen estradiol (VIVELLE-DOT) 0.05 MG/24HR patch Place 1 patch onto the skin 2 (two) times a week.  . Multiple Vitamin (MULTIVITAMIN WITH MINERALS) TABS Take 1 tablet by mouth daily.  . nicotine (NICODERM CQ - DOSED IN MG/24 HOURS) 14 mg/24hr patch Place 14 mg onto the skin daily.  . Omega-3 Fatty Acids (FISH OIL PO) Take 3,000 mg by mouth daily.  Marland Kitchen SYNTHROID 88 MCG tablet Take 1 tablet by mouth daily.  Marland Kitchen zolpidem (AMBIEN) 5 MG tablet Take 1 tablet (5 mg total) by mouth at bedtime as needed for sleep.  . [DISCONTINUED]  fluticasone (FLONASE) 50 MCG/ACT nasal spray Place 1 spray into both nostrils daily.  . [DISCONTINUED] GuaiFENesin (MUCINEX PO) Take 1 tablet by mouth 2 (two) times daily.  . [DISCONTINUED] ipratropium (ATROVENT) 0.03 % nasal spray Place 2 sprays into both nostrils every 6 (six) hours as needed for rhinitis.  . [DISCONTINUED] levothyroxine (SYNTHROID, LEVOTHROID) 100 MCG tablet Take 1 tablet (100 mcg total) by mouth daily before breakfast.  . [DISCONTINUED] Probiotic Product (PROBIOTIC DAILY) CAPS Take 1 capsule by mouth daily. 30 bill   No facility-administered encounter medications on file as of 05/07/2018.      REVIEW OF SYSTEMS  : All other systems reviewed and negative except where noted in the History of Present Illness.   PHYSICAL EXAM: BP 110/66   Pulse 76   Ht 5' (1.524 m)   Wt 148 lb 9.6 oz (67.4 kg)   BMI 29.02 kg/m  General: Well developed white female in no acute distress Head: Normocephalic and atraumatic Eyes:  Sclerae anicteric, conjunctiva pink. Ears: Normal auditory acuity Lungs: Clear throughout to auscultation; no increased WOB. Heart: Regular rate and rhythm; no M/R/G. Abdomen: Soft, but somewhat bloated/distended.  BS present.  Minimal TTP. Musculoskeletal: Symmetrical with no gross deformities  Skin: No lesions on visible extremities Extremities: No edema  Neurological: Alert oriented x 4, grossly non-focal Psychological:  Alert and cooperative. Normal mood and affect  ASSESSMENT AND PLAN: *Crohn's disease of the small bowel:  Just started Stelara.  Complaining of severe fatigue, joint pain, bloating, weight gain, and diarrhea.  Wants to know what the long-term plan is going to be.  I told her that I am going to have to discuss with Dr. Enis Gash when he returns as she has about exhausted her medication options.  For now, I am going to place her back on low dose prednisone, 20 mg daily to start, but can increase to 40 mg daily after a few days if she does not  notice much improvement.  I also offered her some pain medication in the form of tramadol but she declined for now.  I am going to check a fasting AM cortisol level as well with her complaints of severe fatigue and weight gain and extensive use of steroids over the years.   CC:  Schoenhoff, Altamese Cabal, *

## 2018-05-07 NOTE — Patient Instructions (Signed)
Your provider has requested that you go to the basement level for lab work before leaving today. Press "B" on the elevator. The lab is located at the first door on the left as you exit the elevator.  We have sent the following medications to your pharmacy for you to pick up at your convenience: Prednisone

## 2018-05-09 ENCOUNTER — Other Ambulatory Visit: Payer: Self-pay

## 2018-05-09 ENCOUNTER — Encounter: Payer: Self-pay | Admitting: Gastroenterology

## 2018-05-09 ENCOUNTER — Other Ambulatory Visit (INDEPENDENT_AMBULATORY_CARE_PROVIDER_SITE_OTHER): Payer: 59

## 2018-05-09 DIAGNOSIS — K50919 Crohn's disease, unspecified, with unspecified complications: Secondary | ICD-10-CM | POA: Diagnosis not present

## 2018-05-09 LAB — CORTISOL: Cortisol, Plasma: 7.7 ug/dL

## 2018-05-13 ENCOUNTER — Other Ambulatory Visit: Payer: Self-pay

## 2018-05-13 DIAGNOSIS — K5000 Crohn's disease of small intestine without complications: Secondary | ICD-10-CM | POA: Insufficient documentation

## 2018-05-13 MED ORDER — TRAMADOL HCL 50 MG PO TABS: 50.0000 mg | ORAL_TABLET | Freq: Four times a day (QID) | ORAL | 0 refills | Status: DC | PRN

## 2018-05-13 NOTE — Telephone Encounter (Signed)
Yes, tell her to go ahead to go on 40 mg daily and then once I get to talk to Dr. Havery Moros we can instruct her on how we want to proceed with taper.  Thank you,  Jess

## 2018-05-13 NOTE — Progress Notes (Signed)
Agree with assessment as above. My thoughts are as follows: - patient has allergies to thiopurines and methotrexate. Did not do well with Remicade and Humira and Entyvio, now on [place] which she just started - her labs are entirely normal. Prior CT scan in February showed some active disease but she has been treated since that time - too early to say if this is a failure of Stelara. Unclear if her joint pains are related to Stelara or other as she has had these in the past. We don't have a lot of other options at this time for her Crohn's - would continue Stelara and will see if 59m prednisone helps - recommend colonoscopy to confirm active inflammation / active disease to cause her symptoms given her normal labs. Please coordinate this as soon as possible - given her arthralgias recommend Rheumatology consult, Dr. BAmil Amen- I think she would benefit from psychiatry / mental health consult to help her cope this with if she hasn't seen them already - if she has active inflammation on colonoscopy despite regimen she is on, may consider second opinion at tertiary care facility given her course thus far and limited options moving forward.

## 2018-05-14 NOTE — Telephone Encounter (Signed)
Janett Billow you wanted me to speak to you before I called the pt.  I will find you later or you can call me.  Also, I am in Dr Steve Rattler office today.

## 2018-05-15 ENCOUNTER — Encounter: Payer: Self-pay | Admitting: Gastroenterology

## 2018-05-26 ENCOUNTER — Encounter: Payer: Self-pay | Admitting: Gastroenterology

## 2018-06-05 ENCOUNTER — Other Ambulatory Visit: Payer: Self-pay | Admitting: Gastroenterology

## 2018-06-05 ENCOUNTER — Other Ambulatory Visit: Payer: Self-pay

## 2018-06-06 ENCOUNTER — Encounter: Payer: Self-pay | Admitting: Gastroenterology

## 2018-06-06 ENCOUNTER — Telehealth: Payer: Self-pay | Admitting: Gastroenterology

## 2018-06-06 ENCOUNTER — Telehealth: Payer: Self-pay

## 2018-06-06 NOTE — Telephone Encounter (Signed)
Faxed prior authorization request for Stelara to [phone].

## 2018-06-06 NOTE — Telephone Encounter (Signed)
Documentation is on Dr. Doyne Keel desk for completion and signature. Will fax when signed.

## 2018-06-10 ENCOUNTER — Telehealth: Payer: Self-pay | Admitting: Gastroenterology

## 2018-06-10 ENCOUNTER — Encounter: Payer: Self-pay | Admitting: Gastroenterology

## 2018-06-10 NOTE — Telephone Encounter (Signed)
Spoke to patient and she got a call from [facility] about the Stelara injections being denied and that she will receive in the mail the denial information. Let patient know that we received that yesterday as well and will contact them to see why they denied the medication. There is also a part on this form for an appeal and urgent appeal if needed.

## 2018-06-16 ENCOUNTER — Telehealth: Payer: Self-pay | Admitting: Gastroenterology

## 2018-06-16 NOTE — Telephone Encounter (Signed)
Amy R. Nurse navigator([phone] ext [phone]) for stelara calling in. This is the number to call for prior auth [id number] F: [phone]. Patient is due on 06/18/18 for medication.

## 2018-06-17 ENCOUNTER — Other Ambulatory Visit: Payer: Self-pay

## 2018-06-17 ENCOUNTER — Telehealth: Payer: Self-pay | Admitting: Gastroenterology

## 2018-06-17 MED ORDER — USTEKINUMAB 90 MG/ML ~~LOC~~ SOSY: 90.0000 mg | PREFILLED_SYRINGE | SUBCUTANEOUS | 6 refills | Status: DC

## 2018-06-17 NOTE — Telephone Encounter (Signed)
Mardene Celeste, nurse navigator returning phone call. Best # [phone]   Mardene Celeste says that our office needs to contact [place] F: [phone].

## 2018-06-17 NOTE — Telephone Encounter (Signed)
Pt called to let you know the fax number for [place] [facility] [phone]. She stated to have been on the phone with [facility] for an hour. They told pt that they had not received anything from [place]. I informed pt that we already have that fax # and appeal was faxed to that #. Pt just wanting to let you know.

## 2018-06-17 NOTE — Telephone Encounter (Signed)
Pt called to check the status on this PA for stelara. Pls call her.

## 2018-06-17 NOTE — Telephone Encounter (Signed)
Left voice message for the nurse navigator to call back directly to my extension.

## 2018-06-19 NOTE — Telephone Encounter (Signed)
Maggie with [employer] is calling to provide information.   She states DO NOT mark the fax as urgent or it will continue to kick it back to them. She states fax appeal to [phone].  Add case # [id number]  If you have further questions contact supervisor [place] @ [phone]

## 2018-06-19 NOTE — Telephone Encounter (Signed)
Faxed appeal to number just given.

## 2018-06-20 ENCOUNTER — Encounter: Payer: Self-pay | Admitting: Gastroenterology

## 2018-06-23 ENCOUNTER — Encounter: Payer: Self-pay | Admitting: Gastroenterology

## 2018-06-23 MED ORDER — AMBULATORY NON FORMULARY MEDICATION: 0 refills | Status: DC

## 2018-06-23 NOTE — Telephone Encounter (Signed)
RE: Non-Urgent Medical Question  Milus Banister, MD  Sent: Mon June 23, 2018 8:59 AM  To: Marlon Pel, RN      Message   Yes, thanks      ----- Message -----  From: Marlon Pel, RN  Sent: 06/23/2018  8:57 AM  To: Milus Banister, MD  Subject: FW: Non-Urgent Medical Question           Dr. Ardis Hughs you are MD of the day. Ok to send in Rx for NTG ointment for a patient of Dr. Havery Moros?

## 2018-06-24 ENCOUNTER — Encounter: Payer: Self-pay | Admitting: Gastroenterology

## 2018-06-24 ENCOUNTER — Other Ambulatory Visit: Payer: Self-pay

## 2018-06-24 ENCOUNTER — Other Ambulatory Visit (INDEPENDENT_AMBULATORY_CARE_PROVIDER_SITE_OTHER): Payer: 59

## 2018-06-24 DIAGNOSIS — R5383 Other fatigue: Secondary | ICD-10-CM

## 2018-06-24 DIAGNOSIS — K50919 Crohn's disease, unspecified, with unspecified complications: Secondary | ICD-10-CM

## 2018-06-24 LAB — COMPREHENSIVE METABOLIC PANEL
ALK PHOS: 63 U/L (ref 39–117)
ALT: 21 U/L (ref 0–35)
AST: 19 U/L (ref 0–37)
Albumin: 4.4 g/dL (ref 3.5–5.2)
BILIRUBIN TOTAL: 2.4 mg/dL — AB (ref 0.2–1.2)
BUN: 12 mg/dL (ref 6–23)
CALCIUM: 9.1 mg/dL (ref 8.4–10.5)
CO2: 25 mEq/L (ref 19–32)
Chloride: 103 mEq/L (ref 96–112)
Creatinine, Ser: 0.83 mg/dL (ref 0.40–1.20)
GFR: 75.32 mL/min (ref >60.00)
GLUCOSE: 115 mg/dL — AB (ref 70–99)
POTASSIUM: 3.4 meq/L — AB (ref 3.5–5.1)
Sodium: 136 mEq/L (ref 135–145)
TOTAL PROTEIN: 7.4 g/dL (ref 6.0–8.3)

## 2018-06-24 LAB — CBC WITH DIFFERENTIAL/PLATELET
BASOS ABS: 0.1 10*3/uL (ref 0.0–0.1)
Basophils Relative: 0.7 % (ref 0.0–3.0)
EOS PCT: 1 % (ref 0.0–5.0)
Eosinophils Absolute: 0.1 10*3/uL (ref 0.0–0.7)
HEMATOCRIT: 41.6 % (ref 36.0–46.0)
HEMOGLOBIN: 14.5 g/dL (ref 12.0–15.0)
LYMPHS ABS: 1.7 10*3/uL (ref 0.7–4.0)
LYMPHS PCT: 15.9 % (ref 12.0–46.0)
MCHC: 34.9 g/dL (ref 30.0–36.0)
MCV: 91.6 fl (ref 78.0–100.0)
MONOS PCT: 5.7 % (ref 3.0–12.0)
Monocytes Absolute: 0.6 10*3/uL (ref 0.1–1.0)
NEUTROS PCT: 76.7 % (ref 43.0–77.0)
Neutro Abs: 8.2 10*3/uL — ABNORMAL HIGH (ref 1.4–7.7)
Platelets: 264 10*3/uL (ref 150.0–400.0)
RBC: 4.54 Mil/uL (ref 3.87–5.11)
RDW: 12.6 % (ref 11.5–15.5)
WBC: 10.7 10*3/uL — ABNORMAL HIGH (ref 4.0–10.5)

## 2018-06-25 ENCOUNTER — Other Ambulatory Visit: Payer: Self-pay

## 2018-06-25 ENCOUNTER — Encounter: Payer: Self-pay | Admitting: Gastroenterology

## 2018-06-26 ENCOUNTER — Telehealth: Payer: Self-pay | Admitting: Gastroenterology

## 2018-06-26 ENCOUNTER — Other Ambulatory Visit: Payer: Self-pay | Admitting: Gastroenterology

## 2018-06-26 DIAGNOSIS — R748 Abnormal levels of other serum enzymes: Secondary | ICD-10-CM

## 2018-06-26 NOTE — Telephone Encounter (Signed)
Called the patient to check in with her. She's been on Stelara since March. Overall doesn't feel particularly improved in regards to her Crohn's symptoms. She's had worsening joint pains as well - possible related to the Stelara. She's been put back on prednisone 16m / day and feeling much better in this regard. She has a colonoscopy set up in a few weeks. We discussed long term plan. I'm concerned that Stelara isn't working too well and she may be having side effects from it. If her colonoscopy has active inflammation, will need to stop it. If the colonoscopy looks good, will need to discuss if she wants to continue with this or if she doesn't tolerate it, will need to stop. Long term if she continues to have active Crohn's despite multiple medication trials in the past (thiopurines, MTX, anti-TNF, Entyvio) we don't have other good options right now, she would have to use steroids. I've offered her a referral to [facility] for a second opinion. She will think about this. Will await her colonoscopy in a few weeks first, and she has a consult with Rheumatology pending.   Regarding her labs, bili midly elevated. In review of chart it has been periodically elevated in the past. Suspect Gilbert's, will have her come in for full LFTs to fractionate bili and clarify. She agreed.

## 2018-06-27 ENCOUNTER — Other Ambulatory Visit (INDEPENDENT_AMBULATORY_CARE_PROVIDER_SITE_OTHER): Payer: 59

## 2018-06-27 DIAGNOSIS — R748 Abnormal levels of other serum enzymes: Secondary | ICD-10-CM | POA: Diagnosis not present

## 2018-06-27 LAB — HEPATIC FUNCTION PANEL
ALK PHOS: 61 U/L (ref 39–117)
ALT: 20 U/L (ref 0–35)
AST: 14 U/L (ref 0–37)
Albumin: 4.3 g/dL (ref 3.5–5.2)
BILIRUBIN DIRECT: 0.1 mg/dL (ref 0.0–0.3)
BILIRUBIN TOTAL: 0.8 mg/dL (ref 0.2–1.2)
TOTAL PROTEIN: 7.3 g/dL (ref 6.0–8.3)

## 2018-07-10 ENCOUNTER — Encounter: Payer: Self-pay | Admitting: Gastroenterology

## 2018-07-17 ENCOUNTER — Ambulatory Visit (AMBULATORY_SURGERY_CENTER): Payer: Self-pay

## 2018-07-17 VITALS — Ht 60.0 in | Wt 158.0 lb

## 2018-07-17 DIAGNOSIS — K50019 Crohn's disease of small intestine with unspecified complications: Secondary | ICD-10-CM

## 2018-07-17 MED ORDER — NA SULFATE-K SULFATE-MG SULF 17.5-3.13-1.6 GM/177ML PO SOLN: 1.0000 | Freq: Once | ORAL | 0 refills | Status: AC

## 2018-07-17 NOTE — Progress Notes (Signed)
No egg or soy allergy known to patient  No issues with past sedation with any surgeries  or procedures, no intubation problems  No diet pills per patient No home 02 use per patient  No blood thinners per patient  Pt denies issues with constipation  No A fib or A flutter  EMMI video sent to pt's e mail. Pt declines

## 2018-07-21 ENCOUNTER — Other Ambulatory Visit: Payer: Self-pay | Admitting: Orthopedic Surgery

## 2018-07-21 DIAGNOSIS — M545 Low back pain: Secondary | ICD-10-CM

## 2018-07-22 ENCOUNTER — Telehealth: Payer: Self-pay | Admitting: Gastroenterology

## 2018-07-22 NOTE — Telephone Encounter (Signed)
Pt states we changed her from [place] in [place] was sent to pharmacy and husband picked up and paid for It-   I called [facility]- Spoke to [employer], Pharmacist, explained we sent this by accident and forgot to call and cancel and pt's husband paid for it- he said she can bring it back and get a full refund- called pt and explained this to her- she will take Suprep back- pt thankful we called and did this for her- I apologized for the mishap  Lelan Pons PV

## 2018-07-25 ENCOUNTER — Encounter: Payer: Self-pay | Admitting: Gastroenterology

## 2018-07-27 ENCOUNTER — Ambulatory Visit
Admission: RE | Admit: 2018-07-27 | Discharge: 2018-07-27 | Disposition: A | Discharge status: Home / Self Care | Payer: 59 | Source: Ambulatory Visit | Attending: Orthopedic Surgery | Admitting: Orthopedic Surgery

## 2018-07-27 DIAGNOSIS — M545 Low back pain: Secondary | ICD-10-CM

## 2018-07-31 ENCOUNTER — Other Ambulatory Visit: Payer: Self-pay

## 2018-07-31 ENCOUNTER — Ambulatory Visit (AMBULATORY_SURGERY_CENTER): Payer: 59 | Admitting: Gastroenterology

## 2018-07-31 ENCOUNTER — Encounter: Payer: Self-pay | Admitting: Gastroenterology

## 2018-07-31 ENCOUNTER — Telehealth: Payer: Self-pay | Admitting: Gastroenterology

## 2018-07-31 VITALS — BP 114/69 | HR 64 | Temp 98.4°F | Resp 9 | Ht 60.0 in | Wt 158.0 lb

## 2018-07-31 DIAGNOSIS — D125 Benign neoplasm of sigmoid colon: Secondary | ICD-10-CM

## 2018-07-31 DIAGNOSIS — D123 Benign neoplasm of transverse colon: Secondary | ICD-10-CM

## 2018-07-31 DIAGNOSIS — K50019 Crohn's disease of small intestine with unspecified complications: Secondary | ICD-10-CM | POA: Diagnosis not present

## 2018-07-31 MED ORDER — SODIUM CHLORIDE 0.9 % IV SOLN: 500.0000 mL | Freq: Once | INTRAVENOUS | Status: DC

## 2018-07-31 MED ORDER — BUDESONIDE 3 MG PO CPEP: ORAL_CAPSULE | ORAL | 0 refills | Status: AC

## 2018-07-31 NOTE — Progress Notes (Signed)
Called to room to assist during endoscopic procedure.  Patient ID and intended procedure confirmed with present staff. Received instructions for my participation in the procedure from the performing physician.  

## 2018-07-31 NOTE — Progress Notes (Signed)
To PACU, VSS. Report to [email or web address] 

## 2018-07-31 NOTE — Telephone Encounter (Signed)
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 The Delsa Grana is not working and we will stop it.  I discussed options with patient, she's either failed or intolerant to everything we have tried thus far (anti-TNF, Entyvio, Stelara, thiopurines, methotrexate). Recommend referral to tertiary care IBD center of excellence. Following discussion of options she wanted to be referred to [facility], Dr. Jeanne Ivan. Otherwise, she is off prednisone and feeling poorly. Will give her budesonide 35m daily for one month and then taper by 339mevery 2 weeks until done. Hopefully this will minimize systemic side effects she had on prednisone.  JuAlmyra Freean you help refer [facility] and give her budesonide prescription as outlined above? Thanks

## 2018-07-31 NOTE — Op Note (Signed)
[place] Patient Name: [name] Procedure Date: 07/31/2018 7:57 AM MRN: [id number] Endoscopist: Remo Lipps P. Havery Moros , MD Age: 57 Referring MD:  Date of Birth: [birth year] Gender: Female Account #: [id number] Procedure:                Colonoscopy Indications:              Follow-up of Crohn's disease of the small bowel, on                            Stelara, previously failed or intolerant to the                            following: thiopurines, methotrexate, Remicade,                            Humira, and Entyvio Medicines:                Monitored Anesthesia Care Procedure:                Pre-Anesthesia Assessment:                           - Prior to the procedure, a History and Physical                            was performed, and patient medications and                            allergies were reviewed. The patient's tolerance of                            previous anesthesia was also reviewed. The risks                            and benefits of the procedure and the sedation                            options and risks were discussed with the patient.                            All questions were answered, and informed consent                            was obtained. Prior Anticoagulants: The patient has                            taken no previous anticoagulant or antiplatelet                            agents. ASA Grade Assessment: II - A patient with                            mild systemic disease. After reviewing the risks  and benefits, the patient was deemed in                            satisfactory condition to undergo the procedure.                           After obtaining informed consent, the colonoscope                            was passed under direct vision. Throughout the                            procedure, the patient's blood pressure, pulse, and                            oxygen saturations were monitored  continuously. The                            Model PCF-H190DL [id number] scope was introduced                            through the anus and advanced to the the terminal                            ileum. The colonoscopy was performed without                            difficulty. The patient tolerated the procedure                            well. The quality of the bowel preparation was                            adequate. The terminal ileum and the rectum,                            surgical anastomosis were photographed. Scope In: 8:07:19 AM Scope Out: 8:36:23 AM Scope Withdrawal Time: 0 hours 19 minutes 7 seconds  Total Procedure Duration: 0 hours 29 minutes 4 seconds  Findings:                 The perianal and digital rectal examinations were                            normal.                           Patchy inflammation characterized by friability and                            ulcerations was found in the terminal ileum.                            Biopsies were taken with a cold forceps for  histology.                           There was evidence of a prior end-to-side                            ileo-colonic anastomosis in the ascending colon.                            This was patent and was characterized by moderate                            stenosis and ulceration. The anastomosis was                            traversed.                           A 10 mm polyp was found in the proximal transverse                            colon. The polyp was semi-pedunculated. The polyp                            was removed with a hot snare. Resection and                            retrieval were complete.                           A 5 mm polyp was found in the sigmoid colon. The                            polyp was sessile. The polyp was removed with a                            cold snare. Resection and retrieval were complete.                           A  tattoo was seen in the sigmoid colon. The tattoo                            site appeared normal.                           The exam was otherwise without abnormality on                            direct and retroflexion views.                           Biopsies were taken with a cold forceps in the                            transverse and  left colon for histology. Complications:            No immediate complications. Estimated blood loss:                            Minimal. Estimated Blood Loss:     Estimated blood loss was minimal. Impression:               - Crohn's disease with active ileitis. Biopsied.                           - Patent end-to-side ileo-colonic anastomosis,                            characterized by moderate stenosis.                           - One 10 mm polyp in the proximal transverse colon,                            removed with a hot snare. Resected and retrieved.                           - One 5 mm polyp in the sigmoid colon, removed with                            a cold snare. Resected and retrieved.                           - A tattoo was seen in the sigmoid colon. The                            tattoo site appeared normal.                           - The examination was otherwise normal on direct                            and retroflexion views.                           - Biopsies were taken with a cold forceps for                            histology in left and traverse colon.                           Overall, active Crohn's disease on present regimen. Recommendation:           - Patient has a contact number available for                            emergencies. The signs and symptoms of potential                            delayed  complications were discussed with the                            patient. Return to normal activities tomorrow.                            Written discharge instructions were provided to the                             patient.                           - Resume previous diet.                           - Continue present medications.                           - Given endoscopic findings and clinical course on                            Stelara, would stop it. Will discuss referral to                            tertiary care center for discussion of other                            options given failure / intolerance of regimens to                            date                           - Await pathology results. Remo Lipps P. Armbruster, MD 07/31/2018 8:47:13 AM This report has been signed electronically.

## 2018-07-31 NOTE — Progress Notes (Signed)
Pt's states no medical or surgical changes since previsit or office visit. 

## 2018-07-31 NOTE — Patient Instructions (Signed)
Discharge instructions given. Handout on polyps. Biopsies taken. Resume previous medications. YOU HAD AN ENDOSCOPIC PROCEDURE TODAY AT [facility]:   Refer to the procedure report that was given to you for any specific questions about what was found during the examination.  If the procedure report does not answer your questions, please call your gastroenterologist to clarify.  If you requested that your care partner not be given the details of your procedure findings, then the procedure report has been included in a sealed envelope for you to review at your convenience later.  YOU SHOULD EXPECT: Some feelings of bloating in the abdomen. Passage of more gas than usual.  Walking can help get rid of the air that was put into your GI tract during the procedure and reduce the bloating. If you had a lower endoscopy (such as a colonoscopy or flexible sigmoidoscopy) you may notice spotting of blood in your stool or on the toilet paper. If you underwent a bowel prep for your procedure, you may not have a normal bowel movement for a few days.  Please Note:  You might notice some irritation and congestion in your nose or some drainage.  This is from the oxygen used during your procedure.  There is no need for concern and it should clear up in a day or so.  SYMPTOMS TO REPORT IMMEDIATELY:   Following lower endoscopy (colonoscopy or flexible sigmoidoscopy):  Excessive amounts of blood in the stool  Significant tenderness or worsening of abdominal pains  Swelling of the abdomen that is new, acute  Fever of 100F or higher   For urgent or emergent issues, a gastroenterologist can be reached at any hour by calling [phone].   DIET:  We do recommend a small meal at first, but then you may proceed to your regular diet.  Drink plenty of fluids but you should avoid alcoholic beverages for 24 hours.  ACTIVITY:  You should plan to take it easy for the rest of today and you should NOT DRIVE  or use heavy machinery until tomorrow (because of the sedation medicines used during the test).    FOLLOW UP: Our staff will call the number listed on your records the next business day following your procedure to check on you and address any questions or concerns that you may have regarding the information given to you following your procedure. If we do not reach you, we will leave a message.  However, if you are feeling well and you are not experiencing any problems, there is no need to return our call.  We will assume that you have returned to your regular daily activities without incident.  If any biopsies were taken you will be contacted by phone or by letter within the next 1-3 weeks.  Please call us at [phone] if you have not heard about the biopsies in 3 weeks.    SIGNATURES/CONFIDENTIALITY: You and/or your care partner have signed paperwork which will be entered into your electronic medical record.  These signatures attest to the fact that that the information above on your After Visit Summary has been reviewed and is understood.  Full responsibility of the confidentiality of this discharge information lies with you and/or your care-partner.

## 2018-08-01 ENCOUNTER — Telehealth: Payer: Self-pay

## 2018-08-01 NOTE — Telephone Encounter (Signed)
Referral sent to [facility] and Rx for budesonide sent in to her pharmacy.

## 2018-08-01 NOTE — Telephone Encounter (Signed)
  Follow up Call-  Call back number 07/31/2018  Post procedure Call Back phone  # [phone]  Permission to leave phone message Yes  Some recent data might be hidden     Patient questions:  Do you have a fever, pain , or abdominal swelling? No. Pain Score  0 *  Have you tolerated food without any problems? Yes.    Have you been able to return to your normal activities? Yes.    Do you have any questions about your discharge instructions: Diet   No. Medications  No. Follow up visit  No.  Do you have questions or concerns about your Care? No.  Actions: * If pain score is 4 or above: No action needed, pain <4.

## 2018-09-26 ENCOUNTER — Other Ambulatory Visit: Payer: Self-pay | Admitting: Internal Medicine

## 2018-09-26 DIAGNOSIS — Z720 Tobacco use: Secondary | ICD-10-CM

## 2018-09-26 DIAGNOSIS — F17210 Nicotine dependence, cigarettes, uncomplicated: Secondary | ICD-10-CM

## 2018-09-29 DIAGNOSIS — E039 Hypothyroidism, unspecified: Secondary | ICD-10-CM | POA: Insufficient documentation

## 2018-10-01 DIAGNOSIS — M8580 Other specified disorders of bone density and structure, unspecified site: Secondary | ICD-10-CM | POA: Insufficient documentation

## 2018-10-06 ENCOUNTER — Other Ambulatory Visit: Payer: Self-pay | Admitting: Gastroenterology

## 2018-10-07 ENCOUNTER — Ambulatory Visit
Admission: RE | Admit: 2018-10-07 | Discharge: 2018-10-07 | Disposition: A | Discharge status: Home / Self Care | Payer: 59 | Source: Ambulatory Visit | Attending: Internal Medicine | Admitting: Internal Medicine

## 2018-10-07 DIAGNOSIS — Z720 Tobacco use: Secondary | ICD-10-CM

## 2018-10-07 DIAGNOSIS — F17210 Nicotine dependence, cigarettes, uncomplicated: Secondary | ICD-10-CM

## 2018-10-13 ENCOUNTER — Other Ambulatory Visit: Payer: Self-pay | Admitting: Internal Medicine

## 2018-10-13 DIAGNOSIS — E041 Nontoxic single thyroid nodule: Secondary | ICD-10-CM

## 2018-10-16 ENCOUNTER — Ambulatory Visit
Admission: RE | Admit: 2018-10-16 | Discharge: 2018-10-16 | Disposition: A | Discharge status: Home / Self Care | Payer: 59 | Source: Ambulatory Visit | Attending: Internal Medicine | Admitting: Internal Medicine

## 2018-10-16 DIAGNOSIS — E041 Nontoxic single thyroid nodule: Secondary | ICD-10-CM

## 2018-10-21 ENCOUNTER — Other Ambulatory Visit: Payer: Self-pay | Admitting: Internal Medicine

## 2018-10-21 DIAGNOSIS — E042 Nontoxic multinodular goiter: Secondary | ICD-10-CM

## 2018-10-23 ENCOUNTER — Ambulatory Visit
Admission: RE | Admit: 2018-10-23 | Discharge: 2018-10-23 | Disposition: A | Discharge status: Home / Self Care | Payer: 59 | Source: Ambulatory Visit | Attending: Internal Medicine | Admitting: Internal Medicine

## 2018-10-23 ENCOUNTER — Other Ambulatory Visit (HOSPITAL_COMMUNITY)
Admission: RE | Admit: 2018-10-23 | Discharge: 2018-10-23 | Disposition: A | Discharge status: Home / Self Care | Payer: 59 | Source: Ambulatory Visit | Attending: Radiology | Admitting: Radiology

## 2018-10-23 DIAGNOSIS — E042 Nontoxic multinodular goiter: Secondary | ICD-10-CM

## 2018-10-23 DIAGNOSIS — E041 Nontoxic single thyroid nodule: Secondary | ICD-10-CM | POA: Insufficient documentation

## 2018-10-31 ENCOUNTER — Encounter: Payer: Self-pay | Admitting: Internal Medicine

## 2018-10-31 ENCOUNTER — Ambulatory Visit (INDEPENDENT_AMBULATORY_CARE_PROVIDER_SITE_OTHER): Payer: 59 | Admitting: Internal Medicine

## 2018-10-31 VITALS — BP 126/60 | HR 93 | Ht 60.5 in | Wt 165.2 lb

## 2018-10-31 DIAGNOSIS — R5383 Other fatigue: Secondary | ICD-10-CM | POA: Diagnosis not present

## 2018-10-31 DIAGNOSIS — E042 Nontoxic multinodular goiter: Secondary | ICD-10-CM | POA: Diagnosis not present

## 2018-10-31 DIAGNOSIS — E890 Postprocedural hypothyroidism: Secondary | ICD-10-CM | POA: Diagnosis not present

## 2018-10-31 LAB — T4, FREE: FREE T4: 0.95 ng/dL (ref 0.60–1.60)

## 2018-10-31 LAB — TSH: TSH: 1.8 u[IU]/mL (ref 0.35–4.50)

## 2018-10-31 NOTE — Progress Notes (Addendum)
Name: [name]  MRN/ DOB: [id number], 06/23/1961    Age/ Sex: 57 y.o., female    PCP: Lanice Shirts, MD   Reason for Endocrinology Evaluation: Thyroid nodules     Date of Initial Endocrinology Evaluation: 11/02/2018     HPI: [name] is a 57 y.o. female with a past medical history of crohn's disease, Hx of Graves' Disease (S/P RAI ablation). The patient presented for initial endocrinology clinic visit on 11/02/2018 for consultative assistance with her thyroid nodules.   Patient was diagnosed with ?Graves' disease in 2002. She had a 24-hr uptake of 27% at the time with a TSH of 0.19 uIU/mL. Her thyroid uptake and scan was more consistent with multinodular goiter.  She is S/p RAI ablation in 2002. She was started on Levothyroxine shortly after.    ON 10/07/2018, she was incidentally found to have a 13 mm right thyroid nodule on a CT scan of the chest for lung cancer screen. This triggered a thyroid ultrasound , which revealed 3 thyroid nodules as below, two of those with suspicious features.   An FNA of the right mid/inferior nodule and left inferior nodule revealed scant cellularity.   Today her main complaint is fatigue, she has been on glucocorticoids for 2 yrs for chron's disease but has been off of them for ~ >1 month  She has constant diarrhea because of chron's disease, she has an upcoming appointment with a specialist at [facility].  Otherwise, she denies any local neck symptoms such as swe;;ing, pain, dysphagia or voice hoarseness.  She has gained weight , that she attributes to steroids. She has no cold/heat intolerance. She does have aches and pain or joints and muscles, rheumatologist questioning fibromyalgia.          HISTORY:  Past Medical History:  Past Medical History:  Diagnosis Date  . Allergy   . Anal fissure   . Anxiety   . Arthritis    -knees, hips- is bothersome now  . Colon polyps   . Crohn disease ([place])    10-27-15   . Delayed  gastric emptying   . Depression   . GERD (gastroesophageal reflux disease)    occasional  . Graves disease    started with this  . H/O steroid therapy    presently on tapering doses of Prednisone, Entercort.  . Hypothyroidism    current  . IBS (irritable bowel syndrome)   . Osteopenia   . Vitamin B12 deficiency    remains under tx.  injections every 4 weeks-today a little more that 4 weeks.   Past Surgical History:  Past Surgical History:  Procedure Laterality Date  . ABDOMINAL HYSTERECTOMY  2004   complete  . APPENDECTOMY  2004  . BOWEL RESECTION  2004   Crohns  . COLONOSCOPY    . COLONOSCOPY W/ POLYPECTOMY    . COLONOSCOPY WITH PROPOFOL N/A 11/01/2015   Procedure: COLONOSCOPY WITH PROPOFOL;  Surgeon: Garlan Fair, MD;  Location: [facility];  Service: Endoscopy;  Laterality: N/A;  . POLYPECTOMY    . radioiodine therapy  2003  . TEMPOROMANDIBULAR JOINT SURGERY Left 1990  . TUBAL LIGATION  1989  . UMBILICAL HERNIA REPAIR N/A 08/06/2013   Procedure: HERNIA REPAIR UMBILICAL ADULT;  Surgeon: Odis Hollingshead, MD;  Location: [facility];  Service: General;  Laterality: N/A;  WITH MESH  . UPPER GASTROINTESTINAL ENDOSCOPY        Social History:  reports that she quit smoking about  5 months ago. Her smoking use included cigarettes. She has a 0.50 pack-year smoking history. She has never used smokeless tobacco. She reports that she drinks alcohol. She reports that she does not use drugs.  Family History: family history includes Colon cancer in her paternal uncle; Colon polyps in her father; Diabetes in her maternal grandmother; Irritable bowel syndrome in her maternal grandmother and mother.   HOME MEDICATIONS: Current Outpatient Medications on File Prior to Visit  Medication Sig Dispense Refill  . ALPRAZolam (XANAX) 1 MG tablet Take by mouth.    . AMBULATORY NON FORMULARY MEDICATION Medication Name: Nitroglycerine ointment 0.125 %  Apply a pea sized amount internally four  times daily. Dispense 30 GM zero refill 30 g 0  . B Complex-C (SUPER B COMPLEX PO) Take 1 tablet by mouth daily.    . Biotin (BIOTIN 5000) 5 MG CAPS Take 1 capsule by mouth daily.    . cetirizine (ZYRTEC) 10 MG chewable tablet Chew 10 mg by mouth daily.    . Cholecalciferol (VITAMIN D3) 5000 units CAPS Take 1 capsule by mouth daily.    . Coenzyme Q10 (CO Q-10) 300 MG CAPS Take 1 capsule by mouth daily.    Marland Kitchen estradiol (VIVELLE-DOT) 0.05 MG/24HR patch Place 1 patch onto the skin 2 (two) times a week.    . Multiple Vitamin (MULTIVITAMIN WITH MINERALS) TABS Take 1 tablet by mouth daily.    . Omega-3 Fatty Acids (FISH OIL PO) Take 3,000 mg by mouth daily.    Marland Kitchen SYNTHROID 88 MCG tablet Take 1 tablet by mouth daily.  2  . zolpidem (AMBIEN) 5 MG tablet Take 1 tablet (5 mg total) by mouth at bedtime as needed for sleep. 60 tablet 1   No current facility-administered medications on file prior to visit.       REVIEW OF SYSTEMS: A comprehensive ROS was conducted with the patient and is negative except as per HPI and below:  Review of Systems  Constitutional: Positive for malaise/fatigue. Negative for weight loss.  HENT: Negative for congestion and sore throat.   Eyes: Negative for blurred vision and pain.  Respiratory: Negative for cough and shortness of breath.   Cardiovascular: Negative for chest pain and palpitations.  Gastrointestinal: Positive for abdominal pain and diarrhea.  Genitourinary: Negative for frequency.  Musculoskeletal: Positive for joint pain and myalgias.  Neurological: Negative for tingling and tremors.  Endo/Heme/Allergies: Positive for polydipsia.       OBJECTIVE:  VS: BP 126/60 (BP Location: Right Arm, Patient Position: Sitting, Cuff Size: Normal)   Pulse 93   Ht 5' 0.5" (1.537 m)   Wt 165 lb 3.2 oz (74.9 kg)   SpO2 98%   BMI 31.73 kg/m    Wt Readings from Last 3 Encounters:  10/31/18 165 lb 3.2 oz (74.9 kg)  07/31/18 158 lb (71.7 kg)  07/17/18 158 lb (71.7  kg)     EXAM: General: Pt appears well and is in NAD  Hydration: Well-hydrated with moist mucous membranes and good skin turgor  Eyes: External eye exam normal without stare, lid lag or exophthalmos.  EOM intact.  PERRL.  Ears, Nose, Throat: Hearing: Grossly intact bilaterally Dental: Good dentition  Throat: Clear without mass, erythema or exudate  Neck: General: Supple without adenopathy. Thyroid: Thyroid size normal.  No goiter or nodules appreciated. No thyroid bruit.  Lungs: Clear with good BS bilat with no rales, rhonchi, or wheezes  Heart: Auscultation: RRR.  Abdomen: Normoactive bowel sounds, soft, nontender, without masses  or organomegaly palpable  Extremities: Gait and station: Normal gait  Digits and nails: No clubbing, cyanosis, petechiae, or nodes Head and neck: Normal alignment and mobility BL UE: Normal ROM and strength. BL LE: No pretibial edema normal ROM and strength.  Skin: Hair: Texture and amount normal with gender appropriate distribution Skin Inspection: No rashes, acanthosis nigricans/skin tags.  Skin Palpation: Skin temperature, texture, and thickness normal to palpation  Neuro: Cranial nerves: II - XII grossly intact  Motor: Normal strength throughout DTRs: 2+ and symmetric in UE without delay in relaxation phase  Mental Status: Judgment, insight: Intact Orientation: Oriented to time, place, and person Mood and affect: No depression, anxiety, or agitation     DATA REVIEWED:  09/25/18 TSH 1.68  FT4 0.96 ng/dL    Thyroid Ultrasound (10/16/2018)  Parenchymal Echotexture: Markedly heterogenous  Isthmus: Normal in size measuring 0.2 cm in diameter  Right lobe: Normal in size measuring 4.6 x 2.1 x 1.3 cm  Left lobe: Normal in size measuring 3.5 x 1.8 x 1.2 cm  _________________________________________________________  Estimated total number of nodules >/= 1 cm: 2  Number of spongiform nodules >/=  2 cm not described below (TR1):  0  Number of mixed cystic and solid nodules >/= 1.5 cm not described below (TR2): 0  _________________________________________________________  Nodule # 1:  Location: Right; Mid  Maximum size: 0.7 cm; Other 2 dimensions: 0.6 x 0.6 cm  Composition: solid/almost completely solid (2)  Echogenicity: hypoechoic (2)  Shape: not taller-than-wide (0)  Margins: smooth (0)  Echogenic foci: peripheral calcifications (2)  ACR TI-RADS total points: 6.  ACR TI-RADS risk category: TR4 (4-6 points).  ACR TI-RADS recommendations:  Given size (<0.9 cm) and appearance, this nodule does NOT meet TI-RADS criteria for biopsy or dedicated follow-up.  _________________________________________________________  Nodule # 2:  Location: Right; Mid this nodule likely correlates with nodule seen on preceding chest CT  Maximum size: 1.9 cm; Other 2 dimensions: 1.6 x 1.3 cm  Composition: solid/almost completely solid (2)  Echogenicity: isoechoic (1)  Shape: not taller-than-wide (0)  Margins: ill-defined (0)  Echogenic foci: macrocalcifications (1)  Additional echogenic foci 1:  peripheral calcifications (2)  ACR TI-RADS total points: 6.  ACR TI-RADS risk category: TR4 (4-6 points).  ACR TI-RADS recommendations:  **Given size (>/= 1.5 cm) and appearance, fine needle aspiration of this moderately suspicious nodule should be considered based on TI-RADS criteria.  _________________________________________________________  Nodule # 3:  Location: Left; Inferior  Maximum size: 1.3 cm; Other 2 dimensions: 1.0 x 1.0 cm  Composition: solid/almost completely solid (2)  Echogenicity: hypoechoic (2)  Shape: not taller-than-wide (0)  Margins: ill-defined (0)  Echogenic foci: macrocalcifications (1)  Additional echogenic foci 1:  peripheral calcifications (2)  ACR TI-RADS total points: 7.  ACR TI-RADS risk category: TR5 (>/= 7  points).  ACR TI-RADS recommendations:  **Given size (>/= 1.0 cm) and appearance, fine needle aspiration of this highly suspicious nodule should be considered based on TI-RADS criteria.  _________________________________________________________  IMPRESSION: 1. Findings suggestive of multinodular goiter. 2. Nodules #2 and 3 both meet imaging criteria to recommend percutaneous sampling as clinically indicated. Note, nodule #2 likely correlates with the nodule seen on preceding chest CT.  Old records , labs and images have been reviewed.     1. Right superior 0.7x0.6x0.6 cm, hypoechoic , peripheral calcifications 2. Right mid nodule 1.6c1.3x1.9 cm , isoechoic, ill defined margins.   3 Left inferior , hypoechoic, irregular border, calcifications present     ASSESSMENT/PLAN/RECOMMENDATIONS:  1. Hx of Autonomous MNG:   - Pt with no local neck symptoms - She is clinically and biochemically euthyroid.  - Pt with multiple non-specific symptoms that are not attributable to thyroid but could be due a deficiency of some form. - I am questioning her previous diagnosis of Graves' disease, as her uptake and scan was more consistent with autonomous MNG ,or she could have graves plus MNG at the time,  but at this time, this is not going to alter her management as she is on LT-4 replacement.  - I have explained to her that cytologic examination of aspirates from patients with a known history of hyperthyroidism, managed by radioactive iodine therapy can create a diagnostic dilemma, as the distinction between radiation effect and a malignant primary thyroid neoplasm can be very challenging.  - We discussed options to manage these nodules bybeither proceed with a short follow up ultrasound in 3 months vs repeating the FNA , hoping to obtain a better sample.  - Pt agreed to proceed with repeat FNA at this time of the right mid thyroid nodule 1.6x1.3x1.9cm and the left inferior thyroid nodules  1.0x1.0x 1.3 cm     2. Postablative Hypothyroidism  - Pt is clinically and biochemically euthyroid  - Pt educated extensively on the correct way to take levothyroxine (first thing in the morning with water, 30 minutes before eating or taking other medications). - Pt encouraged to double dose the following day if she were to miss a dose given long half-life of levothyroxine. - We also discussed effects of biotin on her TFT's . Pt has not had any biotin in over a week.  Medications:  Continue Levothyroxine 88 mcg daily    3 Fatigue :  - Pt could have relative adrenal insufficieny given long term use of steroids for ~ 2 years, which could explain fatigue.  - I will also proceed with checking Mg and Phos. Given chronic diarrhea.  - We also discussed cosyntropin stimulation test. Pt is out of town this week and will come next week.   F/u in 6 months   Signed electronically by: Mack Guise, MD  [facility] Endocrinology  [facility] [address] [place], [place] [address] Phone: [phone] FAX: [phone]   CC: Lanice Shirts, MD [place] [address] Phone: [phone] Fax: [phone]   Return to Endocrinology clinic as below: Future Appointments  Date Time Provider [place]  05/01/2019  2:00 PM Latunya Kissick, Melanie Crazier, MD [facility] None

## 2018-11-02 DIAGNOSIS — E042 Nontoxic multinodular goiter: Secondary | ICD-10-CM | POA: Insufficient documentation

## 2018-11-02 DIAGNOSIS — E890 Postprocedural hypothyroidism: Secondary | ICD-10-CM | POA: Insufficient documentation

## 2018-11-03 ENCOUNTER — Telehealth: Payer: Self-pay | Admitting: Internal Medicine

## 2018-11-03 ENCOUNTER — Other Ambulatory Visit: Payer: Self-pay

## 2018-11-03 NOTE — Telephone Encounter (Signed)
Left a message for a call back    [facility]

## 2018-11-03 NOTE — Telephone Encounter (Signed)
Dr. Kelton Pillar called patient, please return call.

## 2018-11-03 NOTE — Telephone Encounter (Signed)
Please return pt call

## 2018-11-03 NOTE — Addendum Note (Signed)
Addended by: Dorita Sciara on: 11/03/2018 11:38 AM   Modules accepted: Orders

## 2018-11-10 ENCOUNTER — Other Ambulatory Visit (INDEPENDENT_AMBULATORY_CARE_PROVIDER_SITE_OTHER): Payer: 59

## 2018-11-10 ENCOUNTER — Other Ambulatory Visit: Payer: Self-pay | Admitting: Internal Medicine

## 2018-11-10 DIAGNOSIS — R5383 Other fatigue: Secondary | ICD-10-CM | POA: Diagnosis not present

## 2018-11-10 LAB — COMPREHENSIVE METABOLIC PANEL
ALBUMIN: 4.1 g/dL (ref 3.5–5.2)
ALK PHOS: 55 U/L (ref 39–117)
ALT: 37 U/L — AB (ref 0–35)
AST: 28 U/L (ref 0–37)
BILIRUBIN TOTAL: 0.8 mg/dL (ref 0.2–1.2)
BUN: 7 mg/dL (ref 6–23)
CALCIUM: 8.5 mg/dL (ref 8.4–10.5)
CO2: 23 meq/L (ref 19–32)
Chloride: 109 mEq/L (ref 96–112)
Creatinine, Ser: 0.81 mg/dL (ref 0.40–1.20)
GFR: 77.36 mL/min (ref >60.00)
Glucose, Bld: 98 mg/dL (ref 70–99)
Potassium: 3.4 mEq/L — ABNORMAL LOW (ref 3.5–5.1)
Sodium: 141 mEq/L (ref 135–145)
Total Protein: 6.5 g/dL (ref 6.0–8.3)

## 2018-11-10 LAB — MAGNESIUM: MAGNESIUM: 2.1 mg/dL (ref 1.5–2.5)

## 2018-11-10 LAB — CORTISOL
CORTISOL PLASMA: 18.3 ug/dL
Cortisol, Plasma: 6.9 ug/dL
Cortisol, Plasma: 21.3 ug/dL

## 2018-11-10 LAB — PHOSPHORUS: PHOSPHORUS: 2 mg/dL — AB (ref 2.3–4.6)

## 2018-11-10 MED ORDER — COSYNTROPIN NICU IV SYRINGE 0.25 MG/ML (STANDARD DOSE)
0.2500 mg | Freq: Once | INTRAVENOUS | Status: AC
  Administered 2018-11-10: 0.25 mg via INTRAVENOUS

## 2018-11-11 ENCOUNTER — Telehealth: Payer: Self-pay | Admitting: Internal Medicine

## 2018-11-11 DIAGNOSIS — E876 Hypokalemia: Secondary | ICD-10-CM

## 2018-11-11 MED ORDER — K PHOS MONO-SOD PHOS DI & MONO 155-852-130 MG PO TABS: 250.0000 mg | ORAL_TABLET | Freq: Three times a day (TID) | ORAL | 0 refills | Status: DC

## 2018-11-11 NOTE — Telephone Encounter (Signed)
Patient is returning Tashia's call. Please Advise, thanks

## 2018-11-11 NOTE — Telephone Encounter (Signed)
lft vm to return call

## 2018-11-11 NOTE — Telephone Encounter (Signed)
Patient has called in regards to Phospha. Patient states she sees one of the side effects is swelling in the feet and ankles and they stated they already have this issue and would it cause anything to get worse in her feet?  Please Advise, thanks

## 2018-11-11 NOTE — Telephone Encounter (Signed)
Please advise 

## 2018-11-11 NOTE — Telephone Encounter (Signed)
Spoke to the patient and gave her MD advice from note below and she stated an understanding- she is working with a nutritionist to get the input on what foods she should eat-patient was very thankful for Dr. Maretta Bees and how she treated her

## 2018-11-11 NOTE — Telephone Encounter (Signed)
Discussed lab results with [name]  Normal Cosyntropin stim test Normal Mag  Low K+ and Phosphorus  Unfortunately this is due to active crohn's and active loss.     Recommendations  - Start K-phos 1 tab TID with meals for 2 weeks - Recheck in 1 month - Encouraged foods rich in the above

## 2018-11-13 ENCOUNTER — Ambulatory Visit
Admission: RE | Admit: 2018-11-13 | Discharge: 2018-11-13 | Disposition: A | Discharge status: Home / Self Care | Payer: 59 | Source: Ambulatory Visit | Attending: Internal Medicine | Admitting: Internal Medicine

## 2018-11-13 ENCOUNTER — Other Ambulatory Visit (HOSPITAL_COMMUNITY)
Admission: RE | Admit: 2018-11-13 | Discharge: 2018-11-13 | Disposition: A | Discharge status: Home / Self Care | Payer: 59 | Source: Ambulatory Visit | Attending: Radiology | Admitting: Radiology

## 2018-11-13 DIAGNOSIS — E042 Nontoxic multinodular goiter: Secondary | ICD-10-CM

## 2018-11-13 DIAGNOSIS — E041 Nontoxic single thyroid nodule: Secondary | ICD-10-CM | POA: Insufficient documentation

## 2018-11-18 ENCOUNTER — Other Ambulatory Visit: Payer: 59

## 2018-11-24 ENCOUNTER — Telehealth: Payer: Self-pay | Admitting: Internal Medicine

## 2018-11-24 NOTE — Telephone Encounter (Signed)
Patient states that [facility] is requesting a physical lab faxed to them before scheduling her. Please Advise, thanks

## 2018-11-25 ENCOUNTER — Other Ambulatory Visit: Payer: 59

## 2018-11-25 NOTE — Progress Notes (Signed)
Cardiology Office Note   Date:  11/27/2018   ID:  [name], DOB [birth year], MRN [id number]  PCP:  Lanice Shirts, MD  Cardiologist:   Jenkins Rouge, MD   No chief complaint on file.     History of Present Illness: [name] is a 57 y.o. female who presents for consultation regarding bilateral LE edema. Referred by Dr Mart Piggs Previous smoker with Graves disease post RAI ablation on replacement.She has residual nodules followed by Dr Jeanella Flattery End[facility]  Also history of Chrones disease She has gained 50 lbs over the last few months Tired of steroids and biologicals Seen at [facility] and will require more bowel resection. LE edema worse since weight gain. No history of renal issues or DVT. No cardiac symptoms Sedentary as activity interrupted by need to have BM Primary has not given her diuretic  On thyroid replacement with normal TSH    Past Medical History:  Diagnosis Date  . Allergy   . Anal fissure   . Anxiety   . Arthritis    -knees, hips- is bothersome now  . Colon polyps   . Crohn disease ([place])    10-27-15   . Delayed gastric emptying   . Depression   . GERD (gastroesophageal reflux disease)    occasional  . Graves disease    started with this  . H/O steroid therapy    presently on tapering doses of Prednisone, Entercort.  . Hypothyroidism    current  . IBS (irritable bowel syndrome)   . Osteopenia   . Vitamin B12 deficiency    remains under tx.  injections every 4 weeks-today a little more that 4 weeks.    Past Surgical History:  Procedure Laterality Date  . ABDOMINAL HYSTERECTOMY  2004   complete  . APPENDECTOMY  2004  . BOWEL RESECTION  2004   Crohns  . COLONOSCOPY    . COLONOSCOPY W/ POLYPECTOMY    . COLONOSCOPY WITH PROPOFOL N/A 11/01/2015   Procedure: COLONOSCOPY WITH PROPOFOL;  Surgeon: Garlan Fair, MD;  Location: [facility];  Service: Endoscopy;  Laterality: N/A;  . POLYPECTOMY    . radioiodine therapy  2003  .  TEMPOROMANDIBULAR JOINT SURGERY Left 1990  . TUBAL LIGATION  1989  . UMBILICAL HERNIA REPAIR N/A 08/06/2013   Procedure: HERNIA REPAIR UMBILICAL ADULT;  Surgeon: Odis Hollingshead, MD;  Location: [facility];  Service: General;  Laterality: N/A;  WITH MESH  . UPPER GASTROINTESTINAL ENDOSCOPY       Current Outpatient Medications  Medication Sig Dispense Refill  . ALPRAZolam (XANAX) 1 MG tablet Take by mouth.    . AMBULATORY NON FORMULARY MEDICATION Medication Name: Nitroglycerine ointment 0.125 %  Apply a pea sized amount internally four times daily. Dispense 30 GM zero refill 30 g 0  . B Complex-C (SUPER B COMPLEX PO) Take 1 tablet by mouth daily.    . Biotin (BIOTIN 5000) 5 MG CAPS Take 1 capsule by mouth daily.    . cetirizine (ZYRTEC) 10 MG chewable tablet Chew 10 mg by mouth daily.    . Cholecalciferol (VITAMIN D3) 5000 units CAPS Take 1 capsule by mouth daily.    . Coenzyme Q10 (CO Q-10) 300 MG CAPS Take 1 capsule by mouth daily.    Marland Kitchen estradiol (VIVELLE-DOT) 0.05 MG/24HR patch Place 1 patch onto the skin 2 (two) times a week.    . Multiple Vitamin (MULTIVITAMIN WITH MINERALS) TABS Take 1 tablet by mouth daily.    Marland Kitchen  Omega-3 Fatty Acids (FISH OIL PO) Take 3,000 mg by mouth daily.    . phosphorus (K PHOS NEUTRAL) 155-852-130 MG tablet Take 1 tablet (250 mg total) by mouth 3 (three) times daily with meals. 42 tablet 0  . sulfaSALAzine (AZULFIDINE) 500 MG tablet Take 500 mg by mouth 3 (three) times daily.    Marland Kitchen SYNTHROID 88 MCG tablet Take 1 tablet by mouth daily.  2  . zolpidem (AMBIEN) 5 MG tablet Take 1 tablet (5 mg total) by mouth at bedtime as needed for sleep. 60 tablet 1  . furosemide (LASIX) 20 MG tablet Take 1 tablet (20 mg total) by mouth daily. 90 tablet 3   No current facility-administered medications for this visit.     Allergies:   Mercaptopurine; Aspirin adult low [aspirin]; and Gluten meal    Social History:  The patient  reports that she quit smoking about 6 months ago.  Her smoking use included cigarettes. She has a 0.50 pack-year smoking history. She has never used smokeless tobacco. She reports current alcohol use. She reports that she does not use drugs.   Family History:  The patient's family history includes Colon cancer in her paternal uncle; Colon polyps in her father; Diabetes in her maternal grandmother; Irritable bowel syndrome in her maternal grandmother and mother.    ROS:  Please see the history of present illness.   Otherwise, review of systems are positive for none.   All other systems are reviewed and negative.    PHYSICAL EXAM: VS:  BP 104/66   Pulse 70   Ht 5' (1.524 m)   Wt 161 lb 6.4 oz (73.2 kg)   SpO2 98%   BMI 31.52 kg/m  , BMI Body mass index is 31.52 kg/m. Affect appropriate Healthy:  appears stated age 79: normal Neck supple with no adenopathy JVP normal no bruits no thyromegaly Lungs clear with no wheezing and good diaphragmatic motion Heart:  S1/S2 no murmur, no rub, gallop or click PMI normal Abdomen: benighn, BS positve, no tenderness, no AAA no bruit.  No HSM or HJR Distal pulses intact with no bruits No edema Neuro non-focal Skin warm and dry No muscular weakness    EKG:  NSR normal ECG    Recent Labs: 06/24/2018: Hemoglobin 14.5; Platelets 264.0 10/31/2018: TSH 1.80 11/10/2018: ALT 37; BUN 7; Creatinine, Ser 0.81; Magnesium 2.1; Potassium 3.4; Sodium 141    Lipid Panel No results found for: CHOL, TRIG, HDL, CHOLHDL, VLDL, LDLCALC, LDLDIRECT    Wt Readings from Last 3 Encounters:  11/27/18 161 lb 6.4 oz (73.2 kg)  10/31/18 165 lb 3.2 oz (74.9 kg)  07/31/18 158 lb (71.7 kg)      Other studies Reviewed: Additional studies/ records that were reviewed today include: Notes from primary labs, Notes [facility] .    ASSESSMENT AND PLAN:  1.  Edema:  This is not CHF. She has dependant venous edema from steroids/weight gain or pattern of dorsal edema On feet suggests lymphedema from  inflammatory disease Will check 24 hr urine for nephrotic syndrome. TTE to assess EF and LE duplex to r/o venous reflux. Start lasix 20 mg daily Check BMET and BNP in 2 weeks  2. Thyroid: continue replacement recent thyroid nodule biopsy 11/13/18 normal  3. Chrones:  Previous steroid Rx Cortisol level normal 11/10/18 f/U GI and surgery at [facility]     Current medicines are reviewed at length with the patient today.  The patient does not have concerns regarding medicines.  The  following changes have been made:  Lasix 20 mg daily   Labs/ tests ordered today include: TTE, LE venous duplex, 24 hr urine   Orders Placed This Encounter  Procedures  . Creatinine Clearance, Urine, 24 hour( [facility])  . Basic metabolic panel  . Pro b natriuretic peptide (BNP)  . EKG 12-Lead  . ECHOCARDIOGRAM COMPLETE     Disposition:   FU with cardiology  After testing     Signed, Jenkins Rouge, MD  11/27/2018 2:38 PM    [address], [place], [place]  [address] Phone: [phone]; Fax: [phone]

## 2018-11-25 NOTE — Telephone Encounter (Signed)
Contacted [facility] regarding Pt requested for physical labs to be faxed to their office. Sopke with Sunday Spillers about the same. Sunday Spillers stated latest lab work and additional info such as EKG and latest office visit noted would be needed. Sunday Spillers stated she would contact Pt. And inform her of the same.

## 2018-11-26 ENCOUNTER — Encounter: Payer: Self-pay | Admitting: Cardiovascular Disease

## 2018-11-27 ENCOUNTER — Ambulatory Visit (INDEPENDENT_AMBULATORY_CARE_PROVIDER_SITE_OTHER): Payer: 59 | Admitting: Cardiovascular Disease

## 2018-11-27 ENCOUNTER — Other Ambulatory Visit: Payer: Self-pay

## 2018-11-27 ENCOUNTER — Encounter: Payer: Self-pay | Admitting: Cardiovascular Disease

## 2018-11-27 VITALS — BP 104/66 | HR 70 | Ht 60.0 in | Wt 161.4 lb

## 2018-11-27 DIAGNOSIS — M7989 Other specified soft tissue disorders: Secondary | ICD-10-CM

## 2018-11-27 DIAGNOSIS — N049 Nephrotic syndrome with unspecified morphologic changes: Secondary | ICD-10-CM

## 2018-11-27 MED ORDER — FUROSEMIDE 20 MG PO TABS: 20.0000 mg | ORAL_TABLET | Freq: Every day | ORAL | 3 refills | Status: DC

## 2018-11-27 NOTE — Patient Instructions (Addendum)
Medication Instructions:   If you need a refill on your cardiac medications before your next appointment, please call your pharmacy.   Lab work: Your physician recommends that you go to lab for 24 hour urine.  Your physician recommends that you return for lab work in: 2 weeks for BMET and BNP  If you have labs (blood work) drawn today and your tests are completely normal, you will receive your results only by: Marland Kitchen MyChart Message (if you have MyChart) OR . A paper copy in the mail If you have any lab test that is abnormal or we need to change your treatment, we will call you to review the results.  Testing/Procedures: Your physician has requested that you have an echocardiogram. Echocardiography is a painless test that uses sound waves to create images of your heart. It provides your doctor with information about the size and shape of your heart and how well your heart's chambers and valves are working. This procedure takes approximately one hour. There are no restrictions for this procedure.  Your physician has requested that you have a lower extremity venous duplex. This test is an ultrasound of the veins in the legs. It looks at venous blood flow that carries blood from the heart to the legs. Allow one hour for a Lower Venous exam.  There are no restrictions or special instructions.   Follow-Up: At [facility], you and your health needs are our priority.  As part of our continuing mission to provide you with exceptional heart care, we have created designated Provider Care Teams.  These Care Teams include your primary Cardiologist (physician) and Advanced Practice Providers (APPs -  Physician Assistants and Nurse Practitioners) who all work together to provide you with the care you need, when you need it. You will need a follow up appointment in 1 months.   You may see Dr. Johnsie Cancel or one of the following Advanced Practice Providers on your designated Care Team:   Truitt Merle, NP Cecilie Kicks, NP . Kathyrn Drown, NP

## 2018-11-28 ENCOUNTER — Ambulatory Visit (HOSPITAL_COMMUNITY)
Admission: RE | Admit: 2018-11-28 | Discharge: 2018-11-28 | Disposition: A | Discharge status: Home / Self Care | Payer: 59 | Source: Ambulatory Visit | Attending: Internal Medicine | Admitting: Internal Medicine

## 2018-11-28 DIAGNOSIS — M7989 Other specified soft tissue disorders: Secondary | ICD-10-CM | POA: Insufficient documentation

## 2018-12-02 LAB — CREATININE CLEARANCE, URINE, 24 HOUR
Creatinine Clearance: 96 mL/min (ref 88–128)
Creatinine, 24H Ur: 1221 mg/24 hr (ref 800–1800)
Creatinine, Ser: 0.88 mg/dL (ref 0.57–1.00)
Creatinine, Urine: 76.3 mg/dL
GFR calc Af Amer: 84 mL/min/{1.73_m2} (ref >59)
GFR calc non Af Amer: 73 mL/min/{1.73_m2} (ref >59)

## 2018-12-04 ENCOUNTER — Ambulatory Visit (HOSPITAL_COMMUNITY): Payer: 59 | Attending: Cardiovascular Disease

## 2018-12-04 ENCOUNTER — Other Ambulatory Visit: Payer: Self-pay

## 2018-12-04 DIAGNOSIS — M7989 Other specified soft tissue disorders: Secondary | ICD-10-CM | POA: Insufficient documentation

## 2018-12-09 ENCOUNTER — Other Ambulatory Visit: Payer: 59 | Admitting: *Deleted

## 2018-12-09 DIAGNOSIS — N049 Nephrotic syndrome with unspecified morphologic changes: Secondary | ICD-10-CM

## 2018-12-09 DIAGNOSIS — M7989 Other specified soft tissue disorders: Secondary | ICD-10-CM

## 2018-12-09 LAB — BASIC METABOLIC PANEL
BUN/Creatinine Ratio: 12 (ref 9–23)
BUN: 10 mg/dL (ref 6–24)
CALCIUM: 9.2 mg/dL (ref 8.7–10.2)
CO2: 21 mmol/L (ref 20–29)
Chloride: 104 mmol/L (ref 96–106)
Creatinine, Ser: 0.86 mg/dL (ref 0.57–1.00)
GFR calc Af Amer: 87 mL/min/{1.73_m2} (ref >59)
GFR calc non Af Amer: 75 mL/min/{1.73_m2} (ref >59)
Glucose: 99 mg/dL (ref 65–99)
Potassium: 3.9 mmol/L (ref 3.5–5.2)
Sodium: 141 mmol/L (ref 134–144)

## 2018-12-09 LAB — PRO B NATRIURETIC PEPTIDE: NT-Pro BNP: 39 pg/mL (ref 0–287)

## 2018-12-29 NOTE — Progress Notes (Signed)
Cardiology Office Note   Date:  01/01/2019   ID:  BLUE RUGGERIO, DOB 05-Apr-1961, MRN [id number]  PCP:  Lanice Shirts, MD  Cardiologist:   Jenkins Rouge, MD   No chief complaint on file.     History of Present Illness: [name] is a 58 y.o. female first seen 11/27/18  regarding bilateral LE edema. Referred by Dr Mart Piggs Previous smoker with Graves disease post RAI ablation on replacement.She has residual nodules followed by Dr Jeanella Flattery End[facility]  Also history of Chrones disease She has gained 50 lbs over the last few months Tired of steroids and biologicals Seen at [facility] and will require more bowel resection. LE edema worse since weight gain. No history of renal issues or DVT. No cardiac symptoms Sedentary as activity interrupted by need to have BM Primary has not given her diuretic  On thyroid replacement with normal TSH  Started on lasix 20 mg daily after initial visit   TTE reviewed from 12/04/18 EF 55-60% no significant valve disease and no pulmonary HTN LE venous duplex 11/28/18 no DVT;s  BNP normal 12/09/18   On diuretic K 3.9 and Cr .86   Much improved with just low dose diuretic    Past Medical History:  Diagnosis Date  . Allergy   . Anal fissure   . Anxiety   . Arthritis    -knees, hips- is bothersome now  . Colon polyps   . Crohn disease ([place])    10-27-15   . Delayed gastric emptying   . Depression   . GERD (gastroesophageal reflux disease)    occasional  . Graves disease    started with this  . H/O steroid therapy    presently on tapering doses of Prednisone, Entercort.  . Hypothyroidism    current  . IBS (irritable bowel syndrome)   . Osteopenia   . Vitamin B12 deficiency    remains under tx.  injections every 4 weeks-today a little more that 4 weeks.    Past Surgical History:  Procedure Laterality Date  . ABDOMINAL HYSTERECTOMY  2004   complete  . APPENDECTOMY  2004  . BOWEL RESECTION  2004   Crohns  . COLONOSCOPY    .  COLONOSCOPY W/ POLYPECTOMY    . COLONOSCOPY WITH PROPOFOL N/A 11/01/2015   Procedure: COLONOSCOPY WITH PROPOFOL;  Surgeon: Garlan Fair, MD;  Location: [facility];  Service: Endoscopy;  Laterality: N/A;  . POLYPECTOMY    . radioiodine therapy  2003  . TEMPOROMANDIBULAR JOINT SURGERY Left 1990  . TUBAL LIGATION  1989  . UMBILICAL HERNIA REPAIR N/A 08/06/2013   Procedure: HERNIA REPAIR UMBILICAL ADULT;  Surgeon: Odis Hollingshead, MD;  Location: [facility];  Service: General;  Laterality: N/A;  WITH MESH  . UPPER GASTROINTESTINAL ENDOSCOPY       Current Outpatient Medications  Medication Sig Dispense Refill  . AMBULATORY NON FORMULARY MEDICATION Medication Name: Nitroglycerine ointment 0.125 %  Apply a pea sized amount internally four times daily. Dispense 30 GM zero refill 30 g 0  . B Complex-C (SUPER B COMPLEX PO) Take 1 tablet by mouth daily.    . cetirizine (ZYRTEC) 10 MG chewable tablet Chew 10 mg by mouth daily.    . Cholecalciferol (VITAMIN D3) 5000 units CAPS Take 1 capsule by mouth daily.    . Coenzyme Q10 (CO Q-10) 300 MG CAPS Take 1 capsule by mouth daily.    Marland Kitchen estradiol (VIVELLE-DOT) 0.05 MG/24HR patch Place 1 patch  onto the skin 2 (two) times a week.    . furosemide (LASIX) 20 MG tablet Take 1 tablet (20 mg total) by mouth daily. 90 tablet 3  . Multiple Vitamin (MULTIVITAMIN WITH MINERALS) TABS Take 1 tablet by mouth daily.    . Omega-3 Fatty Acids (FISH OIL PO) Take 3,000 mg by mouth daily.    Marland Kitchen sulfaSALAzine (AZULFIDINE) 500 MG tablet Take 500 mg by mouth 3 (three) times daily.    Marland Kitchen SYNTHROID 88 MCG tablet Take 1 tablet by mouth daily.  2  . zolpidem (AMBIEN) 5 MG tablet Take 1 tablet (5 mg total) by mouth at bedtime as needed for sleep. 60 tablet 1   No current facility-administered medications for this visit.     Allergies:   Mercaptopurine; Aspirin adult low [aspirin]; and Gluten meal    Social History:  The patient  reports that she quit smoking about 7 months  ago. Her smoking use included cigarettes. She has a 0.50 pack-year smoking history. She has never used smokeless tobacco. She reports current alcohol use. She reports that she does not use drugs.   Family History:  The patient's family history includes Colon cancer in her paternal uncle; Colon polyps in her father; Diabetes in her maternal grandmother; Irritable bowel syndrome in her maternal grandmother and mother.    ROS:  Please see the history of present illness.   Otherwise, review of systems are positive for none.   All other systems are reviewed and negative.    PHYSICAL EXAM: VS:  BP 104/64   Pulse 87   Ht 5' (1.524 m)   Wt 159 lb 12.8 oz (72.5 kg)   SpO2 97%   BMI 31.21 kg/m  , BMI Body mass index is 31.21 kg/m. Affect appropriate Healthy:  appears stated age 58: normal Neck supple with no adenopathy JVP normal no bruits no thyromegaly Lungs clear with no wheezing and good diaphragmatic motion Heart:  S1/S2 no murmur, no rub, gallop or click PMI normal Abdomen: benighn, BS positve, no tenderness, no AAA no bruit.  No HSM or HJR Distal pulses intact with no bruits No edema Neuro non-focal Skin warm and dry No muscular weakness    EKG:  NSR normal ECG    Recent Labs: 06/24/2018: Hemoglobin 14.5; Platelets 264.0 10/31/2018: TSH 1.80 11/10/2018: ALT 37; Magnesium 2.1 12/09/2018: BUN 10; Creatinine, Ser 0.86; NT-Pro BNP 39; Potassium 3.9; Sodium 141    Lipid Panel No results found for: CHOL, TRIG, HDL, CHOLHDL, VLDL, LDLCALC, LDLDIRECT    Wt Readings from Last 3 Encounters:  01/01/19 159 lb 12.8 oz (72.5 kg)  11/27/18 161 lb 6.4 oz (73.2 kg)  10/31/18 165 lb 3.2 oz (74.9 kg)      Other studies Reviewed: Additional studies/ records that were reviewed today include: Notes from primary labs, Notes [facility] .    ASSESSMENT AND PLAN:  1.  Edema:  This is not CHF. She has dependant venous edema from steroids/weight gain or pattern of dorsal  edema On feet suggests lymphedema from inflammatory disease BNP normal normal EF by TTE and no DVT on venous duplex Continue low dose lasix  BMET today She still needs 24 hours urine for protein r/o nephrotic syndrome 2. Thyroid: continue replacement recent thyroid nodule biopsy 11/13/18 normal  3. Chrones:  Previous steroid Rx Cortisol level normal 11/10/18 f/U GI and surgery at [facility] in February    Current medicines are reviewed at length with the patient today.  The patient does  not have concerns regarding medicines.  The following changes have been made:  None   Labs/ tests ordered today include: BMET   Orders Placed This Encounter  Procedures  . Basic metabolic panel     Disposition:   FU with cardiology PRN     Signed, Jenkins Rouge, MD  01/01/2019 10:35 AM    [facility] [place], [place], [place]  [address] Phone: [phone]; Fax: [phone]

## 2018-12-31 ENCOUNTER — Telehealth: Payer: Self-pay

## 2018-12-31 DIAGNOSIS — N049 Nephrotic syndrome with unspecified morphologic changes: Secondary | ICD-10-CM

## 2018-12-31 NOTE — Telephone Encounter (Signed)
Ordered 24 hour urine protein test. Will see patient tomorrow at office visit and send her to [facility] for test.

## 2018-12-31 NOTE — Telephone Encounter (Signed)
-----   Message from Josue Hector, MD sent at 12/30/2018  7:15 AM EST ----- Please order correct test ----- Message ----- From: Michaelyn Barter, RN Sent: 12/29/2018   5:05 PM EST To: Josue Hector, MD  I think I ordered the wrong 24 hour urine test. I sent it to you. ----- Message ----- From: Josue Hector, MD Sent: 12/29/2018   4:54 PM EST To: Michaelyn Barter, RN  Did she ever get 24 hour urine protein to r/o nephrotic syndrome ??

## 2019-01-01 ENCOUNTER — Encounter: Payer: Self-pay | Admitting: Cardiovascular Disease

## 2019-01-01 ENCOUNTER — Ambulatory Visit: Payer: 59 | Admitting: Cardiovascular Disease

## 2019-01-01 VITALS — BP 104/64 | HR 87 | Ht 60.0 in | Wt 159.8 lb

## 2019-01-01 DIAGNOSIS — Z79899 Other long term (current) drug therapy: Secondary | ICD-10-CM | POA: Diagnosis not present

## 2019-01-01 DIAGNOSIS — R600 Localized edema: Secondary | ICD-10-CM

## 2019-01-01 LAB — BASIC METABOLIC PANEL
BUN/Creatinine Ratio: 15 (ref 9–23)
BUN: 12 mg/dL (ref 6–24)
CO2: 22 mmol/L (ref 20–29)
Calcium: 9.4 mg/dL (ref 8.7–10.2)
Chloride: 101 mmol/L (ref 96–106)
Creatinine, Ser: 0.82 mg/dL (ref 0.57–1.00)
GFR calc Af Amer: 92 mL/min/{1.73_m2} (ref >59)
GFR calc non Af Amer: 80 mL/min/{1.73_m2} (ref >59)
Glucose: 92 mg/dL (ref 65–99)
Potassium: 3.4 mmol/L — ABNORMAL LOW (ref 3.5–5.2)
Sodium: 138 mmol/L (ref 134–144)

## 2019-01-01 NOTE — Patient Instructions (Signed)
Medication Instructions:   If you need a refill on your cardiac medications before your next appointment, please call your pharmacy.   Lab work: Your physician recommends that you have lab work today BMET  If you have labs (blood work) drawn today and your tests are completely normal, you will receive your results only by: Marland Kitchen MyChart Message (if you have MyChart) OR . A paper copy in the mail If you have any lab test that is abnormal or we need to change your treatment, we will call you to review the results.  Testing/Procedures: None today.  Follow-Up: At [facility], you and your health needs are our priority.  As part of our continuing mission to provide you with exceptional heart care, we have created designated Provider Care Teams.  These Care Teams include your primary Cardiologist (physician) and Advanced Practice Providers (APPs -  Physician Assistants and Nurse Practitioners) who all work together to provide you with the care you need, when you need it. Your physician recommends that you schedule a follow-up appointment as needed with Dr. Johnsie Cancel.

## 2019-01-10 HISTORY — PX: SMALL INTESTINE SURGERY: SHX150

## 2019-02-20 ENCOUNTER — Other Ambulatory Visit: Payer: Self-pay | Admitting: Radiology

## 2019-02-20 DIAGNOSIS — R140 Abdominal distension (gaseous): Secondary | ICD-10-CM

## 2019-02-23 ENCOUNTER — Other Ambulatory Visit: Payer: Self-pay | Admitting: Radiology

## 2019-02-24 ENCOUNTER — Other Ambulatory Visit: Payer: Self-pay | Admitting: Radiology

## 2019-02-25 ENCOUNTER — Ambulatory Visit
Admission: RE | Admit: 2019-02-25 | Discharge: 2019-02-25 | Disposition: A | Discharge status: Home / Self Care | Payer: 59 | Source: Ambulatory Visit | Attending: Colon and Rectal Surgery | Admitting: Colon and Rectal Surgery

## 2019-02-25 ENCOUNTER — Other Ambulatory Visit: Payer: Self-pay

## 2019-02-25 DIAGNOSIS — R140 Abdominal distension (gaseous): Secondary | ICD-10-CM

## 2019-02-25 MED ORDER — IOPAMIDOL (ISOVUE-300) INJECTION 61%
100.0000 mL | Freq: Once | INTRAVENOUS | Status: AC | PRN
  Administered 2019-02-25: 100 mL via INTRAVENOUS

## 2019-05-01 ENCOUNTER — Other Ambulatory Visit: Payer: Self-pay

## 2019-05-01 ENCOUNTER — Encounter: Payer: Self-pay | Admitting: Internal Medicine

## 2019-05-01 ENCOUNTER — Ambulatory Visit: Payer: 59 | Admitting: Internal Medicine

## 2019-05-01 ENCOUNTER — Ambulatory Visit (INDEPENDENT_AMBULATORY_CARE_PROVIDER_SITE_OTHER): Payer: 59 | Admitting: Internal Medicine

## 2019-05-01 VITALS — BP 118/78 | HR 73 | Temp 97.5°F | Ht 60.0 in | Wt 156.6 lb

## 2019-05-01 DIAGNOSIS — E042 Nontoxic multinodular goiter: Secondary | ICD-10-CM

## 2019-05-01 DIAGNOSIS — E890 Postprocedural hypothyroidism: Secondary | ICD-10-CM | POA: Diagnosis not present

## 2019-05-01 LAB — T4, FREE: Free T4: 0.85 ng/dL (ref 0.60–1.60)

## 2019-05-01 LAB — TSH: TSH: 4.47 u[IU]/mL (ref 0.35–4.50)

## 2019-05-01 NOTE — Progress Notes (Signed)
Name: TRINI SOLDO  MRN/ DOB: [id number], 08/23/1961    Age/ Sex: 58 y.o., female     PCP: Lanice Shirts, MD   Reason for Endocrinology Evaluation: Thyroid nodules     Initial Endocrinology Clinic Visit: 10/31/2018    PATIENT IDENTIFIER: Ms. KAYSEE HERGERT is a 58 y.o., female with a past medical history of crohn's disease,and Hx of Graves' Disease (S/P RAI ablation).  . She has followed with [facility] since 10/31/2018 for consultative assistance with management of her thyroid nodules.   HISTORICAL SUMMARY: The patient was first diagnosed with Graves' disease in 2002. She had a 24-hr uptake of 27% at the time with a TSH of 0.19 uIU/mL. Her thyroid uptake and scan was more consistent with multinodular goiter.  She is S/p RAI ablation in 2002. She has been on Levothyroxine shortly after RAI ablation.    On 10/07/2018, she was incidentally found to have a 13 mm right thyroid nodule on a CT scan for lung cancer screen. This triggered a thyroid ultrasound , which revealed 3 thyroid nodules, two of those with suspicious features.   An FNA of the right mid/inferior nodule and left inferior nodule revealed scant cellularity.   Repeat FNA's of the right Mid and left inferior was repeated on 11/13/2018 with benign cytology.    SUBJECTIVE:    Today (05/01/2019):  Ms. Cleckley is here for a 6 month follow up on thyroid nodules. She denies any local neck symptoms. She has lost weight, her fatigue is stable. Diarrhea has resolved since having partial intestinal resection in 01/2019. She is also on Questran and she feels bloated.   She is compliant with LT-4 replacement.      ROS:  As per HPI.   HISTORY:  Past Medical History:  Past Medical History:  Diagnosis Date   Allergy    Anal fissure    Anxiety    Arthritis    -knees, hips- is bothersome now   Colon polyps    Crohn disease ([place])    10-27-15    Delayed gastric emptying    Depression    GERD  (gastroesophageal reflux disease)    occasional   Graves disease    started with this   H/O steroid therapy    presently on tapering doses of Prednisone, Entercort.   Hypothyroidism    current   IBS (irritable bowel syndrome)    Osteopenia    Vitamin B12 deficiency    remains under tx.  injections every 4 weeks-today a little more that 4 weeks.   Past Surgical History:  Past Surgical History:  Procedure Laterality Date   ABDOMINAL HYSTERECTOMY  2004   complete   APPENDECTOMY  2004   BOWEL RESECTION  2004   Crohns   COLONOSCOPY     COLONOSCOPY W/ POLYPECTOMY     COLONOSCOPY WITH PROPOFOL N/A 11/01/2015   Procedure: COLONOSCOPY WITH PROPOFOL;  Surgeon: Garlan Fair, MD;  Location: [facility];  Service: Endoscopy;  Laterality: N/A;   POLYPECTOMY     radioiodine therapy  2003   SMALL INTESTINE SURGERY  01/2019   TEMPOROMANDIBULAR JOINT SURGERY Left 1990   TUBAL LIGATION  4098   UMBILICAL HERNIA REPAIR N/A 08/06/2013   Procedure: HERNIA REPAIR UMBILICAL ADULT;  Surgeon: Odis Hollingshead, MD;  Location: [facility];  Service: General;  Laterality: N/A;  WITH MESH   UPPER GASTROINTESTINAL ENDOSCOPY      Social History:  reports that she quit smoking about  a year ago. Her smoking use included cigarettes. She has a 0.50 pack-year smoking history. She has never used smokeless tobacco. She reports current alcohol use. She reports that she does not use drugs. Family History:  Family History  Problem Relation Age of Onset   Irritable bowel syndrome Mother    Colon polyps Father    Colon cancer Paternal Uncle    Diabetes Maternal Grandmother        type 2   Irritable bowel syndrome Maternal Grandmother    Esophageal cancer Neg Hx    Stomach cancer Neg Hx    Rectal cancer Neg Hx      HOME MEDICATIONS: Allergies as of 05/01/2019      Reactions   Mercaptopurine Hives, Shortness Of Breath, Swelling   Aspirin Adult Low [aspirin] Hives   Gluten Meal     Distend, abd pain, fever, chills      Medication List       Accurate as of May 01, 2019 10:46 AM. If you have any questions, ask your nurse or doctor.        STOP taking these medications   furosemide 20 MG tablet Commonly known as:  LASIX Stopped by:  Dorita Sciara, MD     TAKE these medications   AMBULATORY NON FORMULARY MEDICATION Medication Name: Nitroglycerine ointment 0.125 %  Apply a pea sized amount internally four times daily. Dispense 30 GM zero refill   cetirizine 10 MG chewable tablet Commonly known as:  ZYRTEC Chew 10 mg by mouth daily.   cholestyramine 4 g packet Commonly known as:  QUESTRAN Take 4 g by mouth daily.   Co Q-10 300 MG Caps Take 1 capsule by mouth daily.   estradiol 0.05 MG/24HR patch Commonly known as:  VIVELLE-DOT Place 1 patch onto the skin 2 (two) times a week.   FISH OIL PO Take 3,000 mg by mouth daily.   multivitamin with minerals Tabs tablet Take 1 tablet by mouth daily.   sulfaSALAzine 500 MG tablet Commonly known as:  AZULFIDINE Take 500 mg by mouth 3 (three) times daily.   SUPER B COMPLEX PO Take 1 tablet by mouth daily.   Synthroid 88 MCG tablet Generic drug:  levothyroxine Take 1 tablet by mouth daily.   Vitamin D3 125 MCG (5000 UT) Caps Take 1 capsule by mouth daily.   zolpidem 5 MG tablet Commonly known as:  Ambien Take 1 tablet (5 mg total) by mouth at bedtime as needed for sleep.         OBJECTIVE:   PHYSICAL EXAM: VS: BP 118/78 (BP Location: Right Arm, Patient Position: Sitting, Cuff Size: Normal)    Pulse 73    Temp (!) 97.5 F (36.4 C)    Ht 5' (1.524 m)    Wt 156 lb 9.6 oz (71 kg)    SpO2 98%    BMI 30.58 kg/m    EXAM: General: Pt appears well and is in NAD  Neck: General: Supple without adenopathy. Thyroid: Thyroid size normal.  No goiter or nodules appreciated. No thyroid bruit.  Lungs: Clear with good BS bilat with no rales, rhonchi, or wheezes  Heart: Auscultation: RRR.  Abdomen:  Normoactive bowel sounds, soft, nontender, without masses or organomegaly palpable  Extremities:  BL LE: No pretibial edema normal ROM and strength.  Neuro: Cranial nerves: II - XII grossly intact  Motor: Normal strength throughout DTRs: 2+ and symmetric in UE without delay in relaxation phase  Mental Status: Judgment, insight: Intact  Orientation: Oriented to time, place, and person Mood and affect: No depression, anxiety, or agitation     DATA REVIEWED:  Results for HERTA, HINK (MRN [id number]) as of 05/05/2019 08:02  Ref. Range 05/01/2019 10:53  TSH Latest Ref Range: 0.35 - 4.50 uIU/mL 4.47  T4,Free(Direct) Latest Ref Range: 0.60 - 1.60 ng/dL 0.85      ASSESSMENT / PLAN / RECOMMENDATIONS:   1. Multinodular Goiter   - No local neck symptoms - She is clinically euthyroid  - Will repeat TFT's today  - Reassurance provided with benign FNA's on the right mid pole and left inferior thyroid nodules - Will repeat thyroid ultrasound for stability    2. Postablative Hypothyroidism   - She is clinically euthyroid  - Pt educated extensively on the correct way to take levothyroxine (first thing in the morning with water, 30 minutes before eating or taking other medications). - Pt encouraged to double dose the following day if she were to miss a dose given long half-life of levothyroxine.  Medications  Levothyroxine 88 mcg daily      F/u in 1 yr    Signed electronically by: Mack Guise, MD  [facility]  [facility] [address] [place], [place] [address] Phone: [phone] FAX: [phone]      CC: Lanice Shirts, MD [address] [place] [address] Phone: [phone]  Fax: [phone]   Return to Endocrinology clinic as below: Future Appointments  Date Time Provider [place]  04/29/2020 10:10 AM Lana Flaim, Melanie Crazier, MD [id number] None

## 2019-05-13 ENCOUNTER — Encounter: Payer: Self-pay | Admitting: Internal Medicine

## 2019-05-21 ENCOUNTER — Other Ambulatory Visit: Payer: Self-pay | Admitting: Obstetrics & Gynecology

## 2019-05-21 DIAGNOSIS — R928 Other abnormal and inconclusive findings on diagnostic imaging of breast: Secondary | ICD-10-CM

## 2019-05-21 IMAGING — CT CT CHEST LUNG CANCER SCREENING LOW DOSE W/O CM
2 of 5 series · 15 of 40 positions shown, 18 images · non-contrast
Comparison: None.

CLINICAL DATA: 57-year-old female former smoker, quit 4 months ago,
with 40 pack-year history of smoking, for initial lung cancer
screening

EXAM:
CT CHEST WITHOUT CONTRAST LOW-DOSE FOR LUNG CANCER SCREENING
TECHNIQUE: Multidetector CT imaging of the chest was performed following the
standard protocol without IV contrast.

[Series 4: lung 1.00 br44 cor · coronal · 0.58mm/px · 3 of 258 slices shown]
[im 52/258  lung]
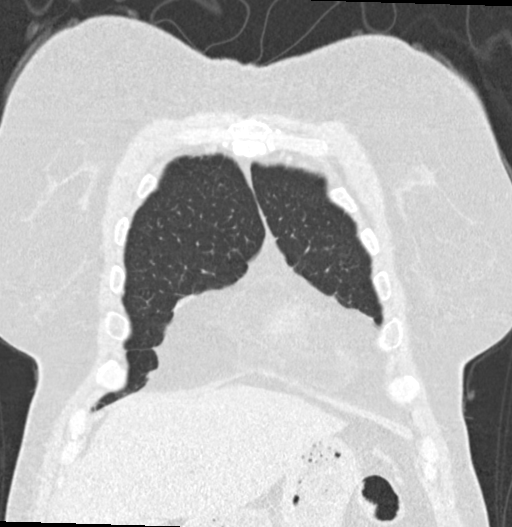
[im 103/258  lung]
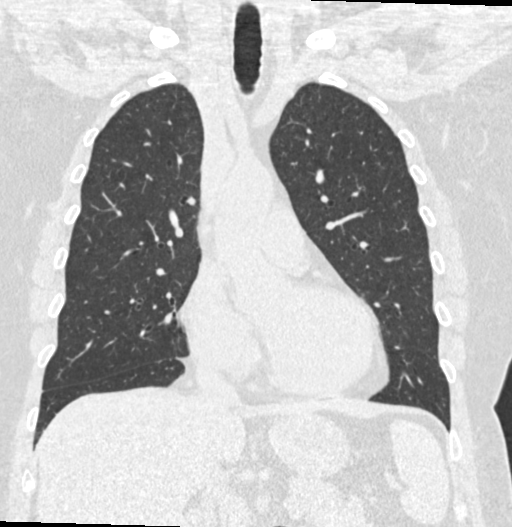
[im 155/258  lung]
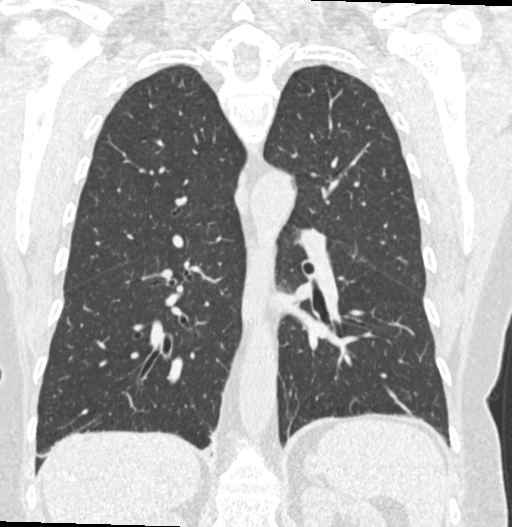

[Series 9: lung 1.00 br60 · axial · 0.62mm/px · z∈[-1141,-861]mm · 12 of 308 slices shown, 15 images]
[im 14/308  mediastinal]
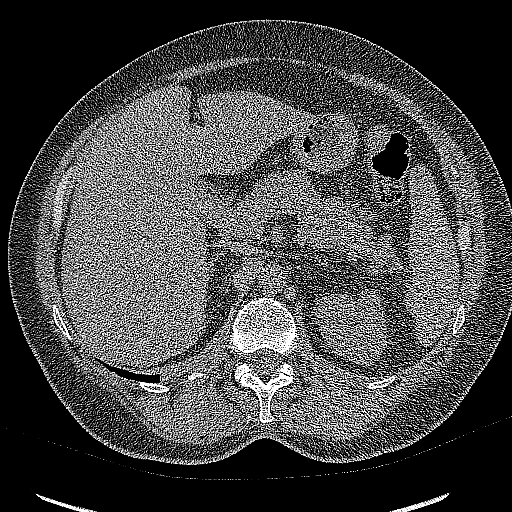
[im 14/308  lung]
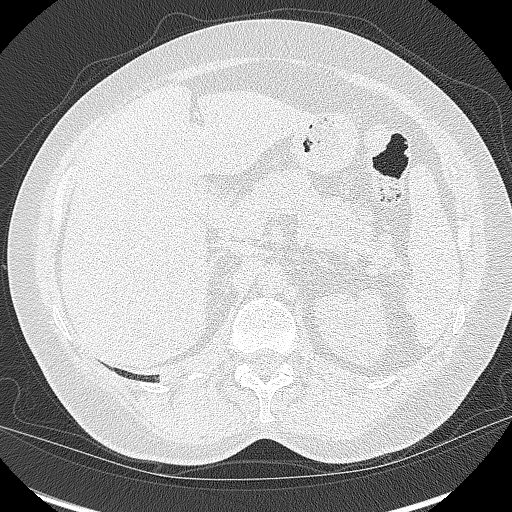
[im 42/308  lung]
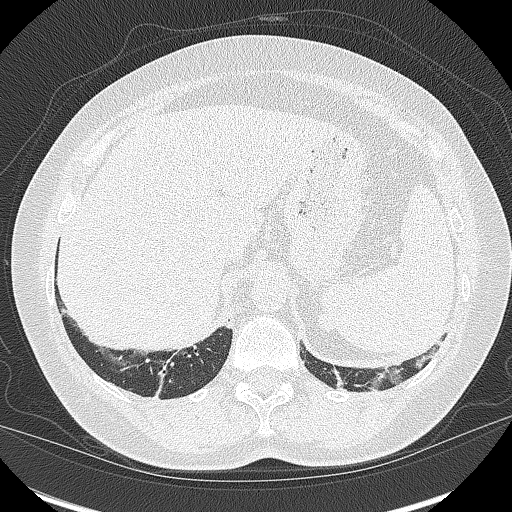
[im 70/308  lung]
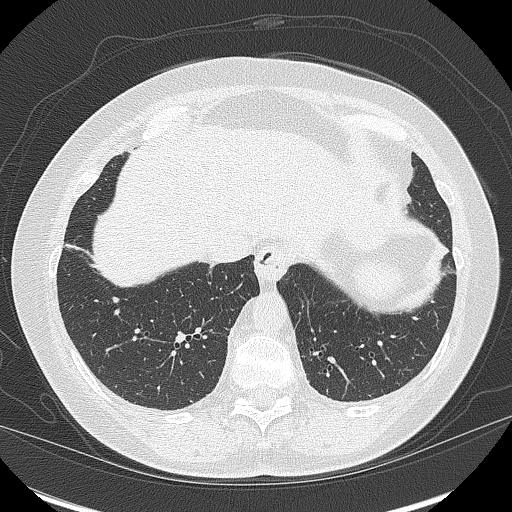
[im 98/308  lung]
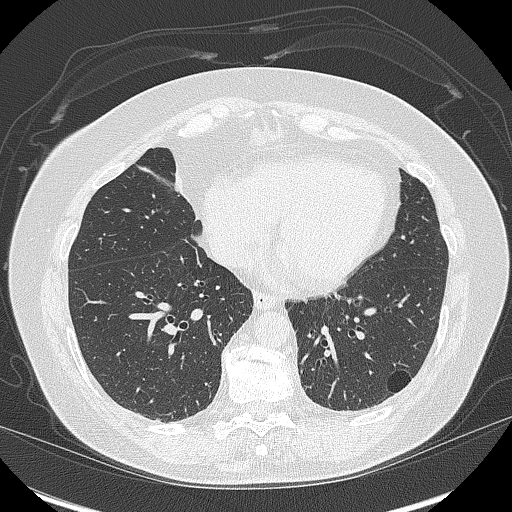
[im 112/308  mediastinal]
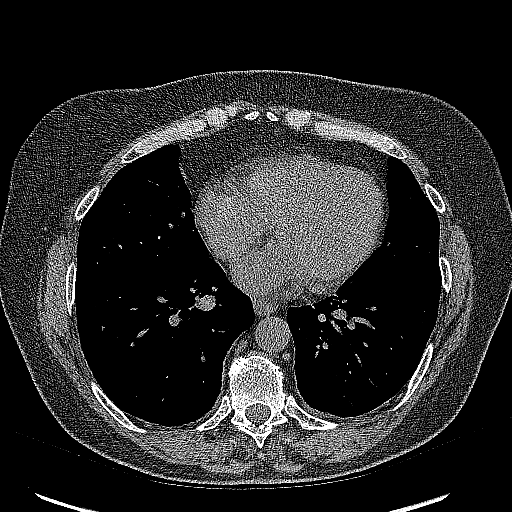
[im 112/308  lung]
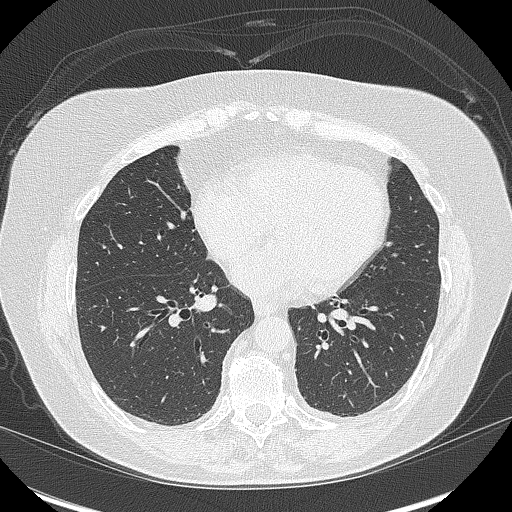
[im 140/308  lung]
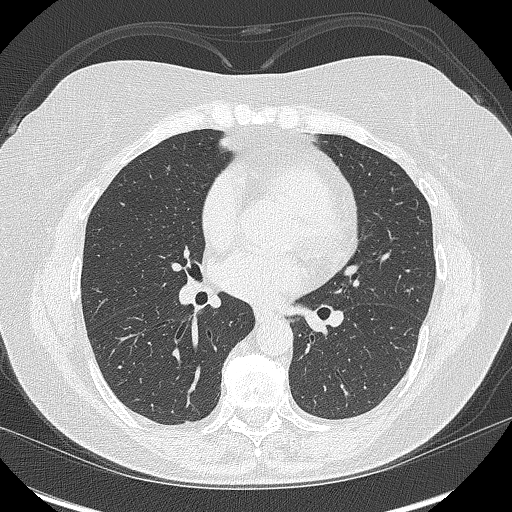
[im 168/308  lung]
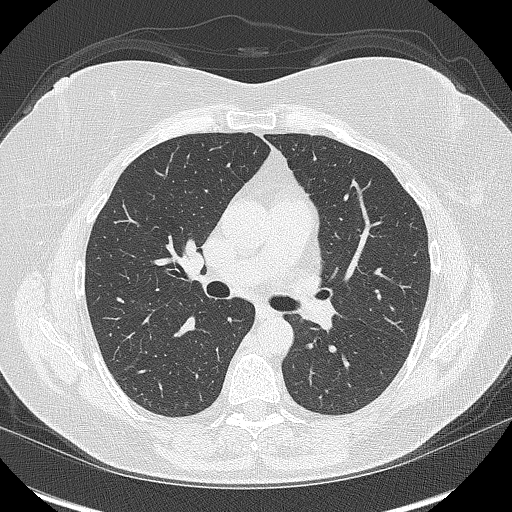
[im 196/308  lung]
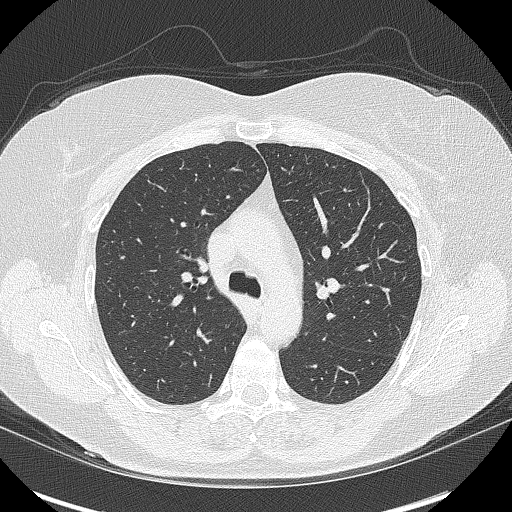
[im 210/308  mediastinal]
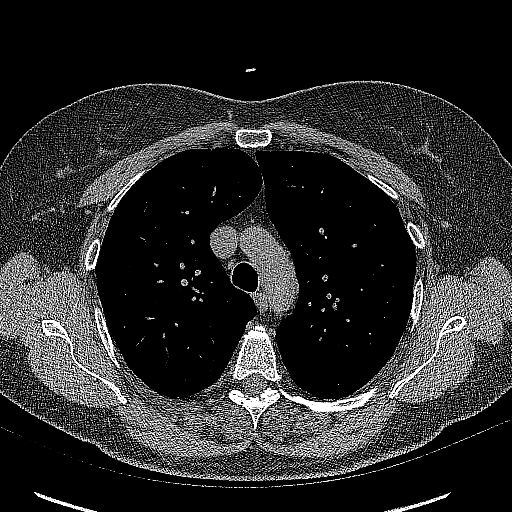
[im 210/308  lung]
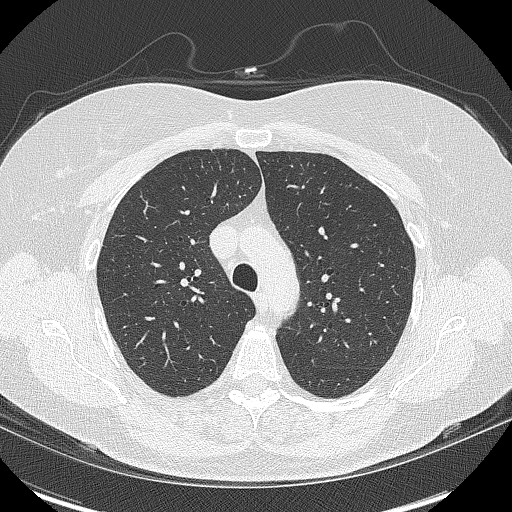
[im 238/308  lung]
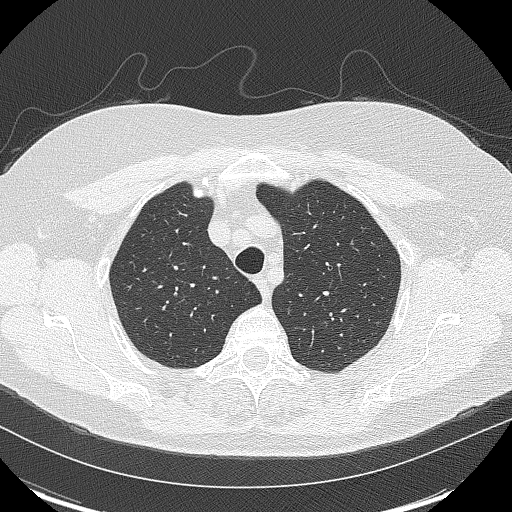
[im 266/308  lung]
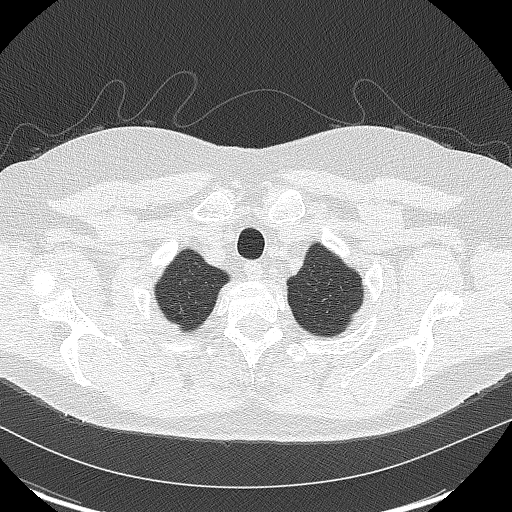
[im 294/308  lung]
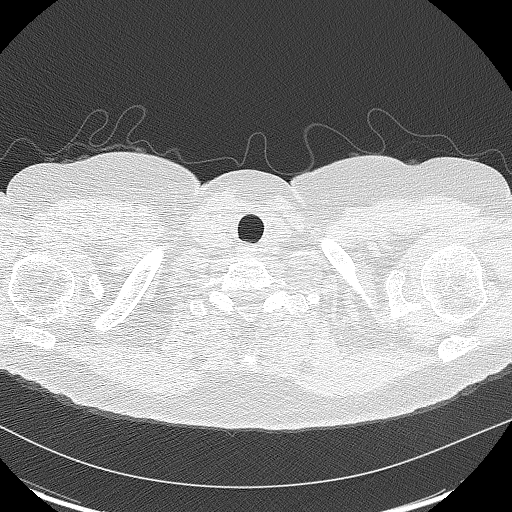

[15 of 40 positions shown; findings below may reference images not displayed]

FINDINGS: Cardiovascular: Heart is normal in size.  No pericardial effusion.

No evidence of thoracic aortic aneurysm. Very mild atherosclerotic
calcifications of the aortic arch.

Mild coronary atherosclerosis of the LAD.

Mediastinum/Nodes: No suspicious mediastinal lymphadenopathy.

13 mm right thyroid nodule.

Lungs/Pleura: Mild biapical pleural-parenchymal scarring.

No focal consolidation. Mild scarring/atelectasis in the bilateral
lower lobes.

No suspicious pulmonary nodules.

No pleural effusion or pneumothorax.

Upper Abdomen: Visualized upper abdomen is grossly unremarkable.

Musculoskeletal: Degenerative changes of the visualized
thoracolumbar spine.
IMPRESSION: Lung-RADS 1, negative. Continue annual screening with low-dose chest
CT without contrast in 12 months.

Aortic Atherosclerosis ([id number]).

## 2019-05-25 ENCOUNTER — Ambulatory Visit
Admission: RE | Admit: 2019-05-25 | Discharge: 2019-05-25 | Disposition: A | Discharge status: Home / Self Care | Payer: 59 | Source: Ambulatory Visit | Attending: Internal Medicine | Admitting: Internal Medicine

## 2019-05-25 DIAGNOSIS — E890 Postprocedural hypothyroidism: Secondary | ICD-10-CM

## 2019-06-02 ENCOUNTER — Other Ambulatory Visit: Payer: Self-pay | Admitting: Obstetrics & Gynecology

## 2019-06-02 ENCOUNTER — Other Ambulatory Visit: Payer: Self-pay

## 2019-06-02 ENCOUNTER — Ambulatory Visit
Admission: RE | Admit: 2019-06-02 | Discharge: 2019-06-02 | Disposition: A | Discharge status: Home / Self Care | Payer: 59 | Source: Ambulatory Visit | Attending: Obstetrics & Gynecology | Admitting: Obstetrics & Gynecology

## 2019-06-02 DIAGNOSIS — R928 Other abnormal and inconclusive findings on diagnostic imaging of breast: Secondary | ICD-10-CM

## 2019-06-03 ENCOUNTER — Ambulatory Visit
Admission: RE | Admit: 2019-06-03 | Discharge: 2019-06-03 | Disposition: A | Discharge status: Home / Self Care | Payer: 59 | Source: Ambulatory Visit | Attending: Obstetrics & Gynecology | Admitting: Obstetrics & Gynecology

## 2019-06-03 ENCOUNTER — Other Ambulatory Visit: Payer: Self-pay

## 2019-06-03 DIAGNOSIS — R928 Other abnormal and inconclusive findings on diagnostic imaging of breast: Secondary | ICD-10-CM

## 2019-06-03 DIAGNOSIS — C50919 Malignant neoplasm of unspecified site of unspecified female breast: Secondary | ICD-10-CM

## 2019-06-03 HISTORY — DX: Malignant neoplasm of unspecified site of unspecified female breast: C50.919

## 2019-06-04 ENCOUNTER — Telehealth: Payer: Self-pay | Admitting: Oncology

## 2019-06-04 NOTE — Telephone Encounter (Signed)
Spoke to patient to confirm morning [facility] appointment for 7/1, packet mailed and emailed to patient

## 2019-06-05 ENCOUNTER — Encounter: Payer: Self-pay | Admitting: *Deleted

## 2019-06-05 DIAGNOSIS — C50411 Malignant neoplasm of upper-outer quadrant of right female breast: Secondary | ICD-10-CM | POA: Insufficient documentation

## 2019-06-09 ENCOUNTER — Encounter: Payer: Self-pay | Admitting: Radiation Oncology

## 2019-06-09 NOTE — Progress Notes (Signed)
Radiation Oncology         ([phone]) [phone] ________________________________  Multidisciplinary Breast Oncology Clinic [facility]) Initial Outpatient Consultation  Name: [name] MRN: [id number]  Date: 06/10/2019  DOB: 05-06-1961  [id number], Altamese Cabal, MD  Fanny Skates, MD   REFERRING PHYSICIAN: Fanny Skates, MD  DIAGNOSIS: The encounter diagnosis was Malignant neoplasm of upper-outer quadrant of right breast in female, estrogen receptor positive ([place]).  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 Malignant neoplasm of upper-outer quadrant of right breast in female, estrogen receptor positive ([place])  C50.411    Z17.0     HISTORY OF PRESENT ILLNESS::[name] is a 58 y.o. female who is presenting to the office today for evaluation of her newly diagnosed breast cancer. She is doing well overall issues with Crohn's disease.  She had routine screening mammography showing a possible abnormality in the right breast. She underwent right diagnostic mammography with tomography and right breast ultrasonography at [facility] on 06/02/2019 showing: 1.5 cm mass in the upper outer aspect of the right breast; no abnormal or enlarged lymph nodes in the right axilla.  Biopsy on 06/03/2019 showed: invasive ductal carcinoma with extravasated mucin, grade 2. Prognostic indicators significant for: estrogen receptor, 100% positive and progesterone receptor, 100% positive, both with strong staining intensity. Proliferation marker Ki67 at 20%. HER2 equivocal. Negative by Fish  Menarche: 58 years old Age at first live birth: 58 years old GP: 3 HRT: yes   The patient was referred today for presentation in the multidisciplinary conference.  Radiology studies and pathology slides were presented there for review and discussion of treatment options.  A consensus was discussed regarding potential next steps.  PREVIOUS  RADIATION THERAPY: No  PAST MEDICAL HISTORY:  Past Medical History:  Diagnosis Date   Allergy    Anal fissure    Anxiety    Arthritis    -knees, hips- is bothersome now   Breast cancer ([place]) 06/03/2019   Colon polyps    Crohn disease ([place])    10-27-15    Delayed gastric emptying    Depression    GERD (gastroesophageal reflux disease)    occasional   Graves disease    started with this   H/O steroid therapy    presently on tapering doses of Prednisone, Entercort.   Hypothyroidism    current   IBS (irritable bowel syndrome)    Osteopenia    Vitamin B12 deficiency    remains under tx.  injections every 4 weeks-today a little more that 4 weeks.    PAST SURGICAL HISTORY: Past Surgical History:  Procedure Laterality Date   ABDOMINAL HYSTERECTOMY  2004   complete   APPENDECTOMY  2004   BOWEL RESECTION  2004   Crohns   COLONOSCOPY     COLONOSCOPY W/ POLYPECTOMY     COLONOSCOPY WITH PROPOFOL N/A 11/01/2015   Procedure: COLONOSCOPY WITH PROPOFOL;  Surgeon: Garlan Fair, MD;  Location: [facility];  Service: Endoscopy;  Laterality: N/A;   POLYPECTOMY     radioiodine therapy  2003   SMALL INTESTINE SURGERY  01/2019   TEMPOROMANDIBULAR JOINT SURGERY Left 1990   TUBAL LIGATION  8280   UMBILICAL HERNIA REPAIR N/A 08/06/2013   Procedure: HERNIA REPAIR UMBILICAL ADULT;  Surgeon: Odis Hollingshead, MD;  Location: [facility];  Service: General;  Laterality: N/A;  WITH MESH   UPPER GASTROINTESTINAL ENDOSCOPY  FAMILY HISTORY:  Family History  Problem Relation Age of Onset   Irritable bowel syndrome Mother    Colon polyps Father    Colon cancer Paternal Uncle    Diabetes Maternal Grandmother        type 2   Irritable bowel syndrome Maternal Grandmother    Esophageal cancer Neg Hx    Stomach cancer Neg Hx    Rectal cancer Neg Hx     SOCIAL HISTORY:  Social History   Socioeconomic History   Marital status: Married    Spouse  name: Not on file   Number of children: 3   Years of education: Not on file   Highest education level: Not on file  Occupational History   Occupation: Set designer strain: Not on file   Food insecurity    Worry: Not on file    Inability: Not on file   Transportation needs    Medical: Not on file    Non-medical: Not on file  Tobacco Use   Smoking status: Former Smoker    Packs/day: 0.25    Years: 2.00    Pack years: 0.50    Types: Cigarettes    Quit date: 05/17/2018    Years since quitting: 1.0   Smokeless tobacco: Never Used  Substance and Sexual Activity   Alcohol use: Yes    Comment: social   Drug use: No   Sexual activity: Not on file  Lifestyle   Physical activity    Days per week: Not on file    Minutes per session: Not on file   Stress: Not on file  Relationships   Social connections    Talks on phone: Not on file    Gets together: Not on file    Attends religious service: Not on file    Active member of club or organization: Not on file    Attends meetings of clubs or organizations: Not on file    Relationship status: Not on file  Other Topics Concern   Not on file  Social History Narrative   Not on file    ALLERGIES:  Allergies  Allergen Reactions   Mercaptopurine Hives, Shortness Of Breath and Swelling   Aspirin Adult Low [Aspirin] Hives   Gluten Meal     Distend, abd pain, fever, chills    MEDICATIONS:  Current Outpatient Medications  Medication Sig Dispense Refill   B Complex-C (SUPER B COMPLEX PO) Take 1 tablet by mouth daily.     cetirizine (ZYRTEC) 10 MG chewable tablet Chew 10 mg by mouth daily.     Cholecalciferol (VITAMIN D3) 5000 units CAPS Take 1 capsule by mouth daily.     cholestyramine (QUESTRAN) 4 g packet Take 4 g by mouth daily.     Coenzyme Q10 (CO Q-10) 300 MG CAPS Take 1 capsule by mouth daily.     estradiol (VIVELLE-DOT) 0.05 MG/24HR patch Place 1 patch onto the  skin 2 (two) times a week.     Multiple Vitamin (MULTIVITAMIN WITH MINERALS) TABS Take 1 tablet by mouth daily.     Omega-3 Fatty Acids (FISH OIL PO) Take 3,000 mg by mouth daily.     sulfaSALAzine (AZULFIDINE) 500 MG tablet Take 500 mg by mouth 3 (three) times daily.     SYNTHROID 88 MCG tablet Take 1 tablet by mouth daily.  2   zolpidem (AMBIEN) 5 MG tablet Take 1 tablet (5 mg total) by mouth at bedtime as needed  for sleep. 60 tablet 1   No current facility-administered medications for this encounter.     REVIEW OF SYSTEMS: A 10+ POINT REVIEW OF SYSTEMS WAS OBTAINED including neurology, dermatology, psychiatry, cardiac, respiratory, lymph, extremities, GI, GU, musculoskeletal, constitutional, reproductive, HEENT. On the provided form, she reports Crohn's disease. She denies breast sys and any other symptoms.    PHYSICAL EXAM:  Vitals - 1 value per visit 05/16/8937  SYSTOLIC 101  DIASTOLIC 51  Pulse 71  Temperature 98  Respirations 18  Weight (lb) 154.1  Height 5' 0"   BMI 30.1  VISIT REPORT    Lungs are clear to auscultation bilaterally. Heart has regular rate and rhythm. No palpable cervical, supraclavicular, or axillary adenopathy. Abdomen soft, non-tender, normal bowel sounds. Breast: left breast with no palpable mass, nipple discharge, or bleeding. right breast with bruisiing in upper outer quadrant.  No palpable mass nipple discharge or bleeding.   ECOG = 1   LABORATORY DATA:  Lab Results  Component Value Date   WBC 4.9 06/10/2019   HGB 14.0 06/10/2019   HCT 41.3 06/10/2019   MCV 90.4 06/10/2019   PLT 269 06/10/2019   Lab Results  Component Value Date   NA 140 06/10/2019   K 3.6 06/10/2019   CL 108 06/10/2019   CO2 21 (L) 06/10/2019   Lab Results  Component Value Date   ALT 44 06/10/2019   AST 21 06/10/2019   ALKPHOS 89 06/10/2019   BILITOT 1.4 (H) 06/10/2019    PULMONARY FUNCTION TEST:   Recent Review Flowsheet Data    There is no flowsheet data to  display.      RADIOGRAPHY: US Breast Ltd Uni Right Inc Axilla  Result Date: 06/02/2019 CLINICAL DATA:  Screening recall for possible right breast mass. EXAM: DIGITAL DIAGNOSTIC RIGHT MAMMOGRAM WITH CAD AND TOMO ULTRASOUND RIGHT BREAST COMPARISON:  Previous exam(s). ACR Breast Density Category b: There are scattered areas of fibroglandular density. FINDINGS: The mass noted in the upper outer right breast persists on the diagnostic spot-compression images. Mass contains fine, mildly pleomorphic, calcifications. It measures approximately 1.2 cm in long axis. There is mild associated architectural distortion. There are no other masses, no other areas of architectural distortion and no other suspicious calcifications. Mammographic images were processed with CAD. On physical exam, a small deep for masses palpated in the upper outer right breast. Targeted ultrasound is performed, showing a mixed echogenicity, but predominantly hyperechoic, oval mass with partly ill-defined margins in the posterior aspect of the right breast at 10 o'clock, 6 cm the nipple, measuring 1.5 x 0.8 x 1.4 cm. Mass contains internal blood flow on color Doppler analysis. Sonographic evaluation of the right axilla demonstrates no abnormal or enlarged lymph nodes. IMPRESSION: 1. Mass in the upper outer aspect of the right breast with suspicious suspicious imaging characteristics for carcinoma. Biopsy is indicated. RECOMMENDATION: Ultrasound-guided core needle biopsy of the upper outer quadrant, 10 o'clock position, right breast mass. This procedure will be scheduled for later this week. I have discussed the findings and recommendations with the patient. Results were also provided in writing at the conclusion of the visit. If applicable, a reminder letter will be sent to the patient regarding the next appointment. BI-RADS CATEGORY  4: Suspicious. Electronically Signed   By: Lajean Manes M.D.   On: 06/02/2019 10:17   Mm Diag Breast Tomo Uni  Right  Result Date: 06/02/2019 CLINICAL DATA:  Screening recall for possible right breast mass. EXAM: DIGITAL DIAGNOSTIC RIGHT MAMMOGRAM WITH CAD  AND TOMO ULTRASOUND RIGHT BREAST COMPARISON:  Previous exam(s). ACR Breast Density Category b: There are scattered areas of fibroglandular density. FINDINGS: The mass noted in the upper outer right breast persists on the diagnostic spot-compression images. Mass contains fine, mildly pleomorphic, calcifications. It measures approximately 1.2 cm in long axis. There is mild associated architectural distortion. There are no other masses, no other areas of architectural distortion and no other suspicious calcifications. Mammographic images were processed with CAD. On physical exam, a small deep for masses palpated in the upper outer right breast. Targeted ultrasound is performed, showing a mixed echogenicity, but predominantly hyperechoic, oval mass with partly ill-defined margins in the posterior aspect of the right breast at 10 o'clock, 6 cm the nipple, measuring 1.5 x 0.8 x 1.4 cm. Mass contains internal blood flow on color Doppler analysis. Sonographic evaluation of the right axilla demonstrates no abnormal or enlarged lymph nodes. IMPRESSION: 1. Mass in the upper outer aspect of the right breast with suspicious suspicious imaging characteristics for carcinoma. Biopsy is indicated. RECOMMENDATION: Ultrasound-guided core needle biopsy of the upper outer quadrant, 10 o'clock position, right breast mass. This procedure will be scheduled for later this week. I have discussed the findings and recommendations with the patient. Results were also provided in writing at the conclusion of the visit. If applicable, a reminder letter will be sent to the patient regarding the next appointment. BI-RADS CATEGORY  4: Suspicious. Electronically Signed   By: Lajean Manes M.D.   On: 06/02/2019 10:17   US Thyroid  Result Date: 05/26/2019 CLINICAL DATA:  Prior ultrasound follow-up.  Follow-up thyroid nodules. History of I 131 thyroid ablation. History of right mid and left lower lobe thyroid nodule biopsies performed in November and again in December of 2019 EXAM: THYROID ULTRASOUND TECHNIQUE: Ultrasound examination of the thyroid gland and adjacent soft tissues was performed. COMPARISON:  10/16/2018; right mid and left inferior thyroid nodule fine-needle aspiration-11/13/2018 and 10/23/2018 FINDINGS: Parenchymal Echotexture: Markedly heterogenous Isthmus: Normal in size measures 0.2 cm in diameter, unchanged Right lobe: Normal in size measuring 4.7 x 1.0 x 1.3 cm, unchanged previously, 4.6 x 2.1 x 1.3 cm Left lobe: Slightly diminutive in size measuring 3.7 x 0.9 x 0.9 cm, unchanged, previously, 3.5 x 1.0 x 1.2 cm _________________________________________________________ Estimated total number of nodules >/= 1 cm: 2 Number of spongiform nodules >/=  2 cm not described below (TR1): 0 Number of mixed cystic and solid nodules >/= 1.5 cm not described below ([place]): 0 _________________________________________________________ Nodule # 1: Prior biopsy: No Location: Right; Mid Maximum size: 0.8 cm; Other 2 dimensions: 0.7 x 0.7 cm, previously, 0.7 x 0.6 x 0.6 cm Composition: solid/almost completely solid (2) Echogenicity: hypoechoic (2) Shape: not taller-than-wide (0) Margins: smooth (0) Echogenic foci: peripheral calcifications (2) ACR TI-RADS total points: 6. ACR TI-RADS risk category:  TR4 (4-6 points). Significant change in size (>/= 20% in two dimensions and minimal increase of 2 mm): No Change in features: No Change in ACR TI-RADS risk category: No ACR TI-RADS recommendations: Given size (<0.9 cm) and appearance, this nodule does NOT meet TI-RADS criteria for biopsy or dedicated follow-up. _________________________________________________________ The previously biopsied approximately 1.8 x 1.1 x 0.9 cm hypoechoic nodule within the mid/inferior aspect the right lobe of the thyroid (labeled 2), is  unchanged to decreased in size compared to the 10/2018 examination, previously, 1.9 x 1.6 x 1.3 cm. The nodule is again noted to contain internal macrocalcifications. Correlation with previous biopsy results is recommended. _________________________________________________________ Nodule # 3: Location:  Left; Superior-not definitely seen on the 11/20 19 examination Maximum size: 0.6 cm; Other 2 dimensions: X 0.4 x 0.4 cm Composition: solid/almost completely solid (2) Echogenicity: hypoechoic (2) Shape: not taller-than-wide (0) Margins: smooth (0) Echogenic foci: none (0) ACR TI-RADS total points: 4. ACR TI-RADS risk category: TR4 (4-6 points). ACR TI-RADS recommendations: Given size (<0.9 cm) and appearance, this nodule does NOT meet TI-RADS criteria for biopsy or dedicated follow-up. _________________________________________________________ The previously biopsied approximately 1.1 x 0.8 x 0.7 cm hypoechoic nodule within the inferior pole the left lobe of the thyroid (labeled 4) is unchanged to decreased in size compared to 10/2018 examination, previously, 1.3 x 1.0 x 1.0 cm. The nodule is again noted to contain peripheral mural calcifications. Correlation with prior biopsy results is recommended. _________________________________________________________ Nodule # 5: Location: Left; Inferior - this nodule/pseudonodule not definitely seen on the 10/2018 examination. Maximum size: 0.6 cm; Other 2 dimensions: 0.6 x 0.5 cm Composition: solid/almost completely solid (2) Echogenicity: hypoechoic (2) Shape: not taller-than-wide (0) Margins: ill-defined (0) Echogenic foci: none (0) ACR TI-RADS total points: 4. ACR TI-RADS risk category: TR4 (4-6 points). ACR TI-RADS recommendations: Given size (<0.9 cm) and appearance, this nodule does NOT meet TI-RADS criteria for biopsy or dedicated follow-up. _________________________________________________________ IMPRESSION: 1. Similar findings of multinodular goiter. No definitive  worrisome new or enlarging thyroid nodules. 2. Previously biopsied right (labeled #2) and left (#4) nodules are unchanged to decreased in size compared to the 10/2018 examination. Correlation with prior biopsy results is recommended. Assuming a benign pathologic diagnosis, repeat sampling and/or continued dedicated follow-up is not recommended. The above is in keeping with the ACR TI-RADS recommendations - J Am Coll Radiol 2017;14:587-595. Electronically Signed   By: Sandi Mariscal M.D.   On: 05/26/2019 08:55   Mm Clip Placement Right  Result Date: 06/03/2019 CLINICAL DATA:  Post ultrasound-guided biopsy of a mass in the right breast at the 10 o'clock position. EXAM: DIAGNOSTIC RIGHT MAMMOGRAM POST ULTRASOUND BIOPSY COMPARISON:  Previous exam(s). FINDINGS: Mammographic images were obtained following ultrasound guided biopsy of a mass in the right breast at the 10 o'clock position. A ribbon shaped biopsy marking clip is present at the site of the biopsied mass in the right breast at the 10 o'clock position. IMPRESSION: Ribbon shaped biopsy marking clip at site of biopsied mass in the right breast at the 10 o'clock position. Final Assessment: Post Procedure Mammograms for Marker Placement Electronically Signed   By: Everlean Alstrom M.D.   On: 06/03/2019 09:14   [place] Rt Breast Bx W Loc Dev 1st Lesion Img Bx Spec US Guide  Addendum Date: 06/09/2019   ADDENDUM REPORT: 06/04/2019 11:46 ADDENDUM: Pathology revealed GRADE II INVASIVE DUCTAL CARCINOMA WITH EXTRAVASATED MUCIN of the Right breast, 10 o'clock. This was found to be concordant by Dr. Everlean Alstrom. Pathology results were discussed with the patient by telephone. The patient reported doing well after the biopsy with tenderness at the site. Post biopsy instructions and care were reviewed and questions were answered. The patient was encouraged to call [facility] for any additional concerns. The patient was referred to [facility] on June 10, 2019. Pathology results reported by Terie Purser, RN on 06/04/2019. Electronically Signed   By: Everlean Alstrom M.D.   On: 06/04/2019 11:46   Result Date: 06/09/2019 CLINICAL DATA:  58 year old female with a suspicious mass in the right breast at the 10 o'clock position. EXAM: ULTRASOUND GUIDED  RIGHT BREAST CORE NEEDLE BIOPSY COMPARISON:  Previous exam(s). FINDINGS: I met with the patient and we discussed the procedure of ultrasound-guided biopsy, including benefits and alternatives. We discussed the high likelihood of a successful procedure. We discussed the risks of the procedure, including infection, bleeding, tissue injury, clip migration, and inadequate sampling. Informed written consent was given. The usual time-out protocol was performed immediately prior to the procedure. Lesion quadrant: Upper-outer Using sterile technique and 1% Lidocaine as local anesthetic, under direct ultrasound visualization, a 12 gauge spring-loaded device was used to perform biopsy of the mass in the right breast at the 10 o'clock position using a lateral to medial approach. At the conclusion of the procedure a ribbon shaped tissue marker clip was deployed into the biopsy cavity. Follow up 2 view mammogram was performed and dictated separately. IMPRESSION: Ultrasound guided biopsy of the mass in the right breast at the 10 o'clock position. No apparent complications. Electronically Signed: By: Everlean Alstrom M.D. On: 06/03/2019 09:01      IMPRESSION: Stage I (T1c cNx) Right Breast UOQ, Invasive Ductal Carcinoma, ER+ / PR+ / Her2 negative, Grade 2.  The patient will be an excellent candidate for breast conservation therapy with radiation therapy as a component of this treatment.  Discussed the overall treatment course side effects and potential toxicities of radiation therapy in the situation patient.  She is interested in breast conserving  surgery rather than mastectomy.  She is in the process of moving to [place] and may wish to seek out her radiation therapy in this area     PLAN:  1. Lumpectomy and sentinel node procedure  2.  Oncotype DX to determine the potential benefit of  adjuvant chemotherapy 3.  Adjuvant radiation therapy as breast conservation treatment 4.  Adjuvant hormonal therapy ------------------------------------------------  Blair Promise, PhD, MD  This document serves as a record of services personally performed by Gery Pray, MD. It was created on his behalf by Wilburn Mylar, a trained medical scribe. The creation of this record is based on the scribe's personal observations and the provider's statements to them. This document has been checked and approved by the attending provider.

## 2019-06-09 NOTE — Progress Notes (Signed)
[facility]  [phone] [phone]    ID: [name] DOB: 1961/01/02  MR#: [id number]  LYY#:[id number]  Patient Care Team: Lanice Shirts, MD as PCP - General (Internal Medicine) Mauro Kaufmann, RN as Oncology Nurse Navigator Rockwell Germany, RN as Oncology Nurse Navigator Fanny Skates, MD as Consulting Physician (General Surgery) [name], Virgie Dad, MD as Consulting Physician (Oncology) Gery Pray, MD as Consulting Physician (Radiation Oncology) Kem Parkinson, MD as Consulting Physician (Colon and Rectal Surgery) Jeanne Ivan, MD as Consulting Physician (Internal Medicine) Armbruster, Carlota Raspberry, MD as Consulting Physician (Gastroenterology) [facility], Melanie Crazier, MD as Consulting Physician (Endocrinology) OTHER MD:   CHIEF COMPLAINT: Estrogen receptor positive breast cancer  CURRENT TREATMENT: Awaiting definitive surgery   HISTORY OF CURRENT ILLNESS: [name] had routine screening mammography showing a possible abnormality in the right breast. She underwent unilateral right diagnostic mammography with tomography and right breast ultrasonography at [facility] on 06/02/2019 showing: Breast Density Category B. The mass noted in the upper outer right breast persists on the diagnostic spot-compression images. Mass contains fine, mildly pleomorphic, calcifications. It measures approximately 1.2 cm in long axis. There is mild associated architectural distortion. There are no other masses, no other areas of architectural distortion and no other suspicious calcifications. Targeted ultrasound is performed, showing a mixed echogenicity, but predominantly hyperechoic, oval mass with partly ill-defined margins in the posterior aspect of the right breast at 10 o'clock, 6 cm from the nipple, measuring 1.5 x 0.8 x 1.4 cm. Mass contains internal blood flow on color Doppler analysis. Sonographic evaluation of the right axilla  demonstrates no abnormal or enlarged lymph nodes.  Accordingly on 06/03/2019 she proceeded to biopsy of the right breast area in question. The pathology from this procedure showed ([id number]): invasive ductal carcinoma with extravasated mucin. Prognostic indicators significant for: estrogen receptor, 100% positive and progesterone receptor, 100% positive, both with strong staining intensity. Proliferation marker Ki67 at 20%. HER2 equivolcal (2+) by immunohistochemistry. Fluorescent in situ hybridization results negative with a signals ratio of 1.19 in the number per cell 1.85  The patient's subsequent history is as detailed below.   INTERVAL HISTORY: [name] was evaluated in the multidisciplinary breast cancer clinic on 06/10/2019.  Her case was also presented at the multidisciplinary breast cancer conference on the same day. At that time a preliminary plan was proposed: Breast conserving surgery with sentinel lymph node sampling, Oncotype testing, adjuvant radiation, adjuvant antiestrogens  She recently underwent a partial colectomy for her Crohn's by Dr. Kem Parkinson at [facility]. She is being followed for this by Dr. Jeanne Ivan at [facility] and by Dr. Howard Cellar locally.    REVIEW OF SYSTEMS: There were no specific symptoms leading to the original mammogram, which was routinely scheduled. [name] denies unusual headaches, visual changes, nausea, vomiting, stiff neck, dizziness, or gait imbalance. There has been no cough, phlegm production, or pleurisy, no chest pain or pressure, and no change in bowel or bladder habits. The patient denies fever, rash, bleeding, unexplained fatigue or unexplained weight loss. A detailed review of systems was otherwise entirely negative.   PAST MEDICAL HISTORY: Past Medical History:  Diagnosis Date   Allergy    Anal fissure    Anxiety    Arthritis    -knees, hips- is bothersome now   Breast cancer ([place]) 06/03/2019   Colon polyps    Crohn disease ([place])      10-27-15    Delayed gastric emptying    Depression  GERD (gastroesophageal reflux disease)    occasional   Graves disease    started with this   H/O steroid therapy    presently on tapering doses of Prednisone, Entercort.   Hypothyroidism    current   IBS (irritable bowel syndrome)    Osteopenia    Vitamin B12 deficiency    remains under tx.  injections every 4 weeks-today a little more that 4 weeks.     PAST SURGICAL HISTORY: Past Surgical History:  Procedure Laterality Date   ABDOMINAL HYSTERECTOMY  2004   complete   APPENDECTOMY  2004   BOWEL RESECTION  2004   Crohns   COLONOSCOPY     COLONOSCOPY W/ POLYPECTOMY     COLONOSCOPY WITH PROPOFOL N/A 11/01/2015   Procedure: COLONOSCOPY WITH PROPOFOL;  Surgeon: Garlan Fair, MD;  Location: [facility];  Service: Endoscopy;  Laterality: N/A;   POLYPECTOMY     radioiodine therapy  2003   SMALL INTESTINE SURGERY  01/2019   TEMPOROMANDIBULAR JOINT SURGERY Left 1990   TUBAL LIGATION  7371   UMBILICAL HERNIA REPAIR N/A 08/06/2013   Procedure: HERNIA REPAIR UMBILICAL ADULT;  Surgeon: Odis Hollingshead, MD;  Location: [facility] ORS;  Service: General;  Laterality: N/A;  WITH MESH   UPPER GASTROINTESTINAL ENDOSCOPY       FAMILY HISTORY: Family History  Problem Relation Age of Onset   Irritable bowel syndrome Mother    Colon polyps Father    Colon cancer Paternal Uncle    Diabetes Maternal Grandmother        type 2   Irritable bowel syndrome Maternal Grandmother    Esophageal cancer Neg Hx    Stomach cancer Neg Hx    Rectal cancer Neg Hx    [name]'s father died from complications of alcoholism at age 41. Patients' mother is 62 as of 06/2019. The patient has no brothers and 1 sister. Patient denies anyone in her family having breast, ovarian, prostate, or pancreatic cancer.   GYNECOLOGIC HISTORY:  No LMP recorded. Patient has had a hysterectomy. 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 Her husband, [name], is in Charity fundraiser. They have three children, [name], and [name]. [name] is Scientist, clinical (histocompatibility and immunogenetics) of court in [place]. [name] is a Investment banker, corporate in [place]. 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 She does not belong to a church, Glass blower/designer, or mosque.   ADVANCED DIRECTIVES: In the absence of any documentation, Vanassa's spouse, [name], is her healthcare power of attorney.      HEALTH MAINTENANCE: Social History   Tobacco Use   Smoking status: Former Smoker    Packs/day: 0.25    Years: 2.00    Pack years: 0.50    Types: Cigarettes    Quit date: 05/17/2018    Years since quitting: 1.0   Smokeless tobacco: Never Used  Substance Use Topics   Alcohol use: Yes    Comment: social   Drug use: No    Colonoscopy: yes  PAP: up to date  Bone density:  Mammography: up to date  Allergies  Allergen Reactions   Mercaptopurine Hives, Shortness Of Breath and Swelling   Aspirin Adult Low [Aspirin] Hives   Gluten Meal     Distend, abd pain, fever, chills  Current Outpatient Medications  Medication Sig Dispense Refill   B Complex-C (SUPER B COMPLEX PO) Take 1 tablet by mouth daily.     cetirizine (ZYRTEC) 10 MG chewable tablet Chew 10 mg by mouth daily.     Cholecalciferol (VITAMIN D3) 5000 units CAPS Take 1 capsule by mouth daily.     cholestyramine (QUESTRAN) 4 g packet Take 4 g by mouth daily.     Coenzyme Q10 (CO Q-10) 300 MG CAPS Take 1 capsule by mouth daily.     estradiol (VIVELLE-DOT) 0.05 MG/24HR patch Place 1 patch onto the skin 2 (two) times a week.     Multiple Vitamin (MULTIVITAMIN WITH MINERALS) TABS Take 1 tablet by mouth daily.     Omega-3 Fatty Acids (FISH OIL PO) Take 3,000 mg by mouth daily.      sulfaSALAzine (AZULFIDINE) 500 MG tablet Take 500 mg by mouth 3 (three) times daily.     SYNTHROID 88 MCG tablet Take 1 tablet by mouth daily.  2   zolpidem (AMBIEN) 5 MG tablet Take 1 tablet (5 mg total) by mouth at bedtime as needed for sleep. 60 tablet 1   No current facility-administered medications for this visit.      OBJECTIVE: Middle-aged white woman who was tearful during today's visit  Vitals:   06/10/19 0905  BP: (!) 117/51  Pulse: 71  Resp: 18  Temp: 98 F (36.7 C)  SpO2: 98%     Body mass index is 30.1 kg/m.   Wt Readings from Last 3 Encounters:  06/10/19 154 lb 1.6 oz (69.9 kg)  05/01/19 156 lb 9.6 oz (71 kg)  01/01/19 159 lb 12.8 oz (72.5 kg)      ECOG FS:1 - Symptomatic but completely ambulatory  Ocular: Sclerae unicteric, pupils round and equal Wearing a mask Lymphatic: No cervical or supraclavicular adenopathy Lungs no rales or rhonchi Heart regular rate and rhythm Abd soft, nontender, positive bowel sounds MSK no focal spinal tenderness, no joint edema Neuro: non-focal, well-oriented, appropriate affect Breasts: I do not palpate a mass in the right breast.  There are no skin or nipple changes of concern.  The left breast is benign.  Both axillae are benign.   LAB RESULTS:  CMP     Component Value Date/Time   NA 140 06/10/2019 0841   NA 138 01/01/2019 1035   K 3.6 06/10/2019 0841   CL 108 06/10/2019 0841   CO2 21 (L) 06/10/2019 0841   GLUCOSE 102 (H) 06/10/2019 0841   BUN 11 06/10/2019 0841   BUN 12 01/01/2019 1035   CREATININE 0.95 06/10/2019 0841   CALCIUM 9.1 06/10/2019 0841   PROT 7.1 06/10/2019 0841   ALBUMIN 4.3 06/10/2019 0841   AST 21 06/10/2019 0841   ALT 44 06/10/2019 0841   ALKPHOS 89 06/10/2019 0841   BILITOT 1.4 (H) 06/10/2019 0841   GFRNONAA >60 06/10/2019 0841   GFRAA >60 06/10/2019 0841    No results found for: TOTALPROTELP, ALBUMINELP, A1GS, A2GS, BETS, BETA2SER, GAMS, MSPIKE, SPEI  No results found for:  KPAFRELGTCHN, LAMBDASER, [facility]  Lab Results  Component Value Date   WBC 4.9 06/10/2019   NEUTROABS 2.9 06/10/2019   HGB 14.0 06/10/2019   HCT 41.3 06/10/2019   MCV 90.4 06/10/2019   PLT 269 06/10/2019    @LASTCHEMISTRY @  No results found for: LABCA2  No components found for: [id number]  No results for input(s): INR in the last 168 hours.  No results found for: LABCA2  No results  found for: BWI203  No results found for: TDH741  No results found for: ULA453  No results found for: CA2729  No components found for: HGQUANT  No results found for: CEA1 / No results found for: CEA1   No results found for: AFPTUMOR  No results found for: CHROMOGRNA  No results found for: PSA1  Appointment on 06/10/2019  Component Date Value Ref Range Status   Sodium 06/10/2019 140  135 - 145 mmol/L Final   Potassium 06/10/2019 3.6  3.5 - 5.1 mmol/L Final   Chloride 06/10/2019 108  98 - 111 mmol/L Final   CO2 06/10/2019 21* 22 - 32 mmol/L Final   Glucose, Bld 06/10/2019 102* 70 - 99 mg/dL Final   BUN 06/10/2019 11  6 - 20 mg/dL Final   Creatinine 06/10/2019 0.95  0.44 - 1.00 mg/dL Final   Calcium 06/10/2019 9.1  8.9 - 10.3 mg/dL Final   Total Protein 06/10/2019 7.1  6.5 - 8.1 g/dL Final   Albumin 06/10/2019 4.3  3.5 - 5.0 g/dL Final   AST 06/10/2019 21  15 - 41 U/L Final   ALT 06/10/2019 44  0 - 44 U/L Final   Alkaline Phosphatase 06/10/2019 89  38 - 126 U/L Final   Total Bilirubin 06/10/2019 1.4* 0.3 - 1.2 mg/dL Final   GFR, Est Non Af Am 06/10/2019 >60  >60 mL/min Final   GFR, Est AFR Am 06/10/2019 >60  >60 mL/min Final   Anion gap 06/10/2019 11  5 - 15 Final   Performed at [facility], [place] [address], [place], [place] [address]   WBC Count 06/10/2019 4.9  4.0 - 10.5 K/uL Final   RBC 06/10/2019 4.57  3.87 - 5.11 MIL/uL Final   Hemoglobin 06/10/2019 14.0  12.0 - 15.0 g/dL Final   HCT 06/10/2019 41.3  36.0 - 46.0 % Final   MCV  06/10/2019 90.4  80.0 - 100.0 fL Final   MCH 06/10/2019 30.6  26.0 - 34.0 pg Final   MCHC 06/10/2019 33.9  30.0 - 36.0 g/dL Final   RDW 06/10/2019 12.6  11.5 - 15.5 % Final   Platelet Count 06/10/2019 269  150 - 400 K/uL Final   nRBC 06/10/2019 0.0  0.0 - 0.2 % Final   Neutrophils Relative % 06/10/2019 58  % Final   Neutro Abs 06/10/2019 2.9  1.7 - 7.7 K/uL Final   Lymphocytes Relative 06/10/2019 30  % Final   Lymphs Abs 06/10/2019 1.5  0.7 - 4.0 K/uL Final   Monocytes Relative 06/10/2019 9  % Final   Monocytes Absolute 06/10/2019 0.4  0.1 - 1.0 K/uL Final   Eosinophils Relative 06/10/2019 2  % Final   Eosinophils Absolute 06/10/2019 0.1  0.0 - 0.5 K/uL Final   Basophils Relative 06/10/2019 0  % Final   Basophils Absolute 06/10/2019 0.0  0.0 - 0.1 K/uL Final   Immature Granulocytes 06/10/2019 1  % Final   Abs Immature Granulocytes 06/10/2019 0.03  0.00 - 0.07 K/uL Final   Performed at [facility], [place] [address] [place], [place] [address]    (this displays the last labs from the last 3 days)  No results found for: TOTALPROTELP, ALBUMINELP, A1GS, A2GS, BETS, BETA2SER, GAMS, MSPIKE, SPEI (this displays SPEP labs)  No results found for: KPAFRELGTCHN, LAMBDASER, KAPLAMBRATIO (kappa/lambda light chains)  No results found for: HGBA, HGBA2QUANT, HGBFQUANT, HGBSQUAN (Hemoglobinopathy evaluation)   No results found for: LDH  No results found for: IRON, TIBC,  IRONPCTSAT (Iron and TIBC)  No results found for: FERRITIN  Urinalysis No results found for: COLORURINE, APPEARANCEUR, LABSPEC, PHURINE, GLUCOSEU, HGBUR, BILIRUBINUR, KETONESUR, PROTEINUR, UROBILINOGEN, NITRITE, LEUKOCYTESUR   STUDIES:  US Breast Ltd Uni Right Inc Axilla  Result Date: 06/02/2019 CLINICAL DATA:  Screening recall for possible right breast mass. EXAM: DIGITAL DIAGNOSTIC RIGHT MAMMOGRAM WITH CAD AND TOMO ULTRASOUND RIGHT BREAST COMPARISON:  Previous exam(s). ACR Breast  Density Category b: There are scattered areas of fibroglandular density. FINDINGS: The mass noted in the upper outer right breast persists on the diagnostic spot-compression images. Mass contains fine, mildly pleomorphic, calcifications. It measures approximately 1.2 cm in long axis. There is mild associated architectural distortion. There are no other masses, no other areas of architectural distortion and no other suspicious calcifications. Mammographic images were processed with CAD. On physical exam, a small deep for masses palpated in the upper outer right breast. Targeted ultrasound is performed, showing a mixed echogenicity, but predominantly hyperechoic, oval mass with partly ill-defined margins in the posterior aspect of the right breast at 10 o'clock, 6 cm the nipple, measuring 1.5 x 0.8 x 1.4 cm. Mass contains internal blood flow on color Doppler analysis. Sonographic evaluation of the right axilla demonstrates no abnormal or enlarged lymph nodes. IMPRESSION: 1. Mass in the upper outer aspect of the right breast with suspicious suspicious imaging characteristics for carcinoma. Biopsy is indicated. RECOMMENDATION: Ultrasound-guided core needle biopsy of the upper outer quadrant, 10 o'clock position, right breast mass. This procedure will be scheduled for later this week. I have discussed the findings and recommendations with the patient. Results were also provided in writing at the conclusion of the visit. If applicable, a reminder letter will be sent to the patient regarding the next appointment. BI-RADS CATEGORY  4: Suspicious. Electronically Signed   By: Lajean Manes M.D.   On: 06/02/2019 10:17   Mm Diag Breast Tomo Uni Right  Result Date: 06/02/2019 CLINICAL DATA:  Screening recall for possible right breast mass. EXAM: DIGITAL DIAGNOSTIC RIGHT MAMMOGRAM WITH CAD AND TOMO ULTRASOUND RIGHT BREAST COMPARISON:  Previous exam(s). ACR Breast Density Category b: There are scattered areas of fibroglandular  density. FINDINGS: The mass noted in the upper outer right breast persists on the diagnostic spot-compression images. Mass contains fine, mildly pleomorphic, calcifications. It measures approximately 1.2 cm in long axis. There is mild associated architectural distortion. There are no other masses, no other areas of architectural distortion and no other suspicious calcifications. Mammographic images were processed with CAD. On physical exam, a small deep for masses palpated in the upper outer right breast. Targeted ultrasound is performed, showing a mixed echogenicity, but predominantly hyperechoic, oval mass with partly ill-defined margins in the posterior aspect of the right breast at 10 o'clock, 6 cm the nipple, measuring 1.5 x 0.8 x 1.4 cm. Mass contains internal blood flow on color Doppler analysis. Sonographic evaluation of the right axilla demonstrates no abnormal or enlarged lymph nodes. IMPRESSION: 1. Mass in the upper outer aspect of the right breast with suspicious suspicious imaging characteristics for carcinoma. Biopsy is indicated. RECOMMENDATION: Ultrasound-guided core needle biopsy of the upper outer quadrant, 10 o'clock position, right breast mass. This procedure will be scheduled for later this week. I have discussed the findings and recommendations with the patient. Results were also provided in writing at the conclusion of the visit. If applicable, a reminder letter will be sent to the patient regarding the next appointment. BI-RADS CATEGORY  4: Suspicious. Electronically Signed   By: Shanon Brow  Ormond M.D.   On: 06/02/2019 10:17   US Thyroid  Result Date: 05/26/2019 CLINICAL DATA:  Prior ultrasound follow-up. Follow-up thyroid nodules. History of I 131 thyroid ablation. History of right mid and left lower lobe thyroid nodule biopsies performed in November and again in December of 2019 EXAM: THYROID ULTRASOUND TECHNIQUE: Ultrasound examination of the thyroid gland and adjacent soft tissues was  performed. COMPARISON:  10/16/2018; right mid and left inferior thyroid nodule fine-needle aspiration-11/13/2018 and 10/23/2018 FINDINGS: Parenchymal Echotexture: Markedly heterogenous Isthmus: Normal in size measures 0.2 cm in diameter, unchanged Right lobe: Normal in size measuring 4.7 x 1.0 x 1.3 cm, unchanged previously, 4.6 x 2.1 x 1.3 cm Left lobe: Slightly diminutive in size measuring 3.7 x 0.9 x 0.9 cm, unchanged, previously, 3.5 x 1.0 x 1.2 cm _________________________________________________________ Estimated total number of nodules >/= 1 cm: 2 Number of spongiform nodules >/=  2 cm not described below (TR1): 0 Number of mixed cystic and solid nodules >/= 1.5 cm not described below ([place]): 0 _________________________________________________________ Nodule # 1: Prior biopsy: No Location: Right; Mid Maximum size: 0.8 cm; Other 2 dimensions: 0.7 x 0.7 cm, previously, 0.7 x 0.6 x 0.6 cm Composition: solid/almost completely solid (2) Echogenicity: hypoechoic (2) Shape: not taller-than-wide (0) Margins: smooth (0) Echogenic foci: peripheral calcifications (2) ACR TI-RADS total points: 6. ACR TI-RADS risk category:  TR4 (4-6 points). Significant change in size (>/= 20% in two dimensions and minimal increase of 2 mm): No Change in features: No Change in ACR TI-RADS risk category: No ACR TI-RADS recommendations: Given size (<0.9 cm) and appearance, this nodule does NOT meet TI-RADS criteria for biopsy or dedicated follow-up. _________________________________________________________ The previously biopsied approximately 1.8 x 1.1 x 0.9 cm hypoechoic nodule within the mid/inferior aspect the right lobe of the thyroid (labeled 2), is unchanged to decreased in size compared to the 10/2018 examination, previously, 1.9 x 1.6 x 1.3 cm. The nodule is again noted to contain internal macrocalcifications. Correlation with previous biopsy results is recommended. _________________________________________________________ Nodule #  3: Location: Left; Superior-not definitely seen on the 11/20 19 examination Maximum size: 0.6 cm; Other 2 dimensions: X 0.4 x 0.4 cm Composition: solid/almost completely solid (2) Echogenicity: hypoechoic (2) Shape: not taller-than-wide (0) Margins: smooth (0) Echogenic foci: none (0) ACR TI-RADS total points: 4. ACR TI-RADS risk category: TR4 (4-6 points). ACR TI-RADS recommendations: Given size (<0.9 cm) and appearance, this nodule does NOT meet TI-RADS criteria for biopsy or dedicated follow-up. _________________________________________________________ The previously biopsied approximately 1.1 x 0.8 x 0.7 cm hypoechoic nodule within the inferior pole the left lobe of the thyroid (labeled 4) is unchanged to decreased in size compared to 10/2018 examination, previously, 1.3 x 1.0 x 1.0 cm. The nodule is again noted to contain peripheral mural calcifications. Correlation with prior biopsy results is recommended. _________________________________________________________ Nodule # 5: Location: Left; Inferior - this nodule/pseudonodule not definitely seen on the 10/2018 examination. Maximum size: 0.6 cm; Other 2 dimensions: 0.6 x 0.5 cm Composition: solid/almost completely solid (2) Echogenicity: hypoechoic (2) Shape: not taller-than-wide (0) Margins: ill-defined (0) Echogenic foci: none (0) ACR TI-RADS total points: 4. ACR TI-RADS risk category: TR4 (4-6 points). ACR TI-RADS recommendations: Given size (<0.9 cm) and appearance, this nodule does NOT meet TI-RADS criteria for biopsy or dedicated follow-up. _________________________________________________________ IMPRESSION: 1. Similar findings of multinodular goiter. No definitive worrisome new or enlarging thyroid nodules. 2. Previously biopsied right (labeled #2) and left (#4) nodules are unchanged to decreased in size compared to the 10/2018 examination. Correlation with  prior biopsy results is recommended. Assuming a benign pathologic diagnosis, repeat sampling  and/or continued dedicated follow-up is not recommended. The above is in keeping with the ACR TI-RADS recommendations - J Am Coll Radiol 2017;14:587-595. Electronically Signed   By: Sandi Mariscal M.D.   On: 05/26/2019 08:55   Mm Clip Placement Right  Result Date: 06/03/2019 CLINICAL DATA:  Post ultrasound-guided biopsy of a mass in the right breast at the 10 o'clock position. EXAM: DIAGNOSTIC RIGHT MAMMOGRAM POST ULTRASOUND BIOPSY COMPARISON:  Previous exam(s). FINDINGS: Mammographic images were obtained following ultrasound guided biopsy of a mass in the right breast at the 10 o'clock position. A ribbon shaped biopsy marking clip is present at the site of the biopsied mass in the right breast at the 10 o'clock position. IMPRESSION: Ribbon shaped biopsy marking clip at site of biopsied mass in the right breast at the 10 o'clock position. Final Assessment: Post Procedure Mammograms for Marker Placement Electronically Signed   By: Everlean Alstrom M.D.   On: 06/03/2019 09:14   [place] Rt Breast Bx W Loc Dev 1st Lesion Img Bx Spec US Guide  Addendum Date: 06/09/2019   ADDENDUM REPORT: 06/04/2019 11:46 ADDENDUM: Pathology revealed GRADE II INVASIVE DUCTAL CARCINOMA WITH EXTRAVASATED MUCIN of the Right breast, 10 o'clock. This was found to be concordant by Dr. Everlean Alstrom. Pathology results were discussed with the patient by telephone. The patient reported doing well after the biopsy with tenderness at the site. Post biopsy instructions and care were reviewed and questions were answered. The patient was encouraged to call [facility] for any additional concerns. The patient was referred to [facility] on June 10, 2019. Pathology results reported by Terie Purser, RN on 06/04/2019. Electronically Signed   By: Everlean Alstrom M.D.   On: 06/04/2019 11:46   Result Date: 06/09/2019 CLINICAL DATA:  58 year old female  with a suspicious mass in the right breast at the 10 o'clock position. EXAM: ULTRASOUND GUIDED RIGHT BREAST CORE NEEDLE BIOPSY COMPARISON:  Previous exam(s). FINDINGS: I met with the patient and we discussed the procedure of ultrasound-guided biopsy, including benefits and alternatives. We discussed the high likelihood of a successful procedure. We discussed the risks of the procedure, including infection, bleeding, tissue injury, clip migration, and inadequate sampling. Informed written consent was given. The usual time-out protocol was performed immediately prior to the procedure. Lesion quadrant: Upper-outer Using sterile technique and 1% Lidocaine as local anesthetic, under direct ultrasound visualization, a 12 gauge spring-loaded device was used to perform biopsy of the mass in the right breast at the 10 o'clock position using a lateral to medial approach. At the conclusion of the procedure a ribbon shaped tissue marker clip was deployed into the biopsy cavity. Follow up 2 view mammogram was performed and dictated separately. IMPRESSION: Ultrasound guided biopsy of the mass in the right breast at the 10 o'clock position. No apparent complications. Electronically Signed: By: Everlean Alstrom M.D. On: 06/03/2019 09:01     ELIGIBLE FOR AVAILABLE RESEARCH PROTOCOL: no   ASSESSMENT: 58 y.o. [place], [place] woman status post right breast upper outer quadrant biopsy 06/03/2019 for an invasive ductal carcinoma, grade 2, estrogen and progesterone receptor positive, HER-2 not amplified by FISH, with an MIB-1 of 20%  (1) definitive surgery pending  (2) Oncotype testing to be obtained  (3) adjuvant radiation as appropriate  (4) antiestrogen therapy to start at the completion of local treatment.  PLAN: I spent approximately 60 minutes face to face with Radley with more than 50% of that time spent in counseling and coordination of care. Specifically we reviewed the biology of the patient's diagnosis and the  specifics of her situation.  We first reviewed the fact that cancer is not one disease but more than 100 different diseases and that it is important to keep them separate-- otherwise when friends and relatives discuss their own cancer experiences with Peni confusion can result. Similarly we explained that if breast cancer spreads to the bone or liver, the patient would not have bone cancer or liver cancer, but breast cancer in the bone and breast cancer in the liver: one cancer in three places-- not 3 different cancers which otherwise would have to be treated in 3 different ways.  We discussed the difference between local and systemic therapy. In terms of loco-regional treatment, lumpectomy plus radiation is equivalent to mastectomy as far as survival is concerned. For this reason, and because the cosmetic results are generally superior, we recommend breast conserving surgery.   We then discussed the rationale for systemic therapy. There is some risk that this cancer may have already spread to other parts of her body. Patients frequently ask at this point about bone scans, CAT scans and PET scans to find out if they have occult breast cancer somewhere else. The problem is that in early stage disease we are much more likely to find false positives then true cancers and this would expose the patient to unnecessary procedures as well as unnecessary radiation. Scans cannot answer the question the patient really would like to know, which is whether she has microscopic disease elsewhere in her body. For those reasons we do not recommend them.  Of course we would proceed to aggressive evaluation of any symptoms that might suggest metastatic disease, but that is not the case here.  Next we went over the options for systemic therapy which are anti-estrogens, anti-HER-2 immunotherapy, and chemotherapy. Ommie does not meet criteria for anti-HER-2 immunotherapy. She is a good candidate for anti-estrogens.  The  question of chemotherapy is more complicated. Chemotherapy is most effective in rapidly growing, aggressive tumors. It is much less effective in low-grade, slow growing cancers. Lashanda 's breast cancer is both intermediate in grade and and proliferation status.. For that reason we are going to request an Oncotype from the definitive surgical sample, as suggested by NCCN guidelines.  That should help was make an informed decision regarding whether she should receive chemotherapy or not.  She understands my expectation is that she likely will not benefit significantly from chemotherapy  With her husband being mostly out of town, they are being in the middle of a move to the [place] area, her recent colon surgery, and now breast cancer, Kenney Houseman has a lot on her plate.  She will have her surgery here in town perhaps in 2 weeks or so but I do not see how she can have her radiation here if she is going to be moving to [place] in August.  We will ask our radiation oncologist to make an appropriate referral.  Assuming she does not receive chemotherapy, which is my hope, she will then proceed to antiestrogens.  If she does have radiation in [place] she could be referred to a medical oncologist there by the radiation oncologist.  Otherwise she will return to see me here and we will discuss antiestrogens or other systemic treatment at that time  Anaysha has a good understanding  of the overall plan. She agrees with it. She knows the goal of treatment in her case is cure. She will call with any problems that may develop before her next visit here.  Kier Smead, Virgie Dad, MD  06/10/19 1:15 PM Medical Oncology and Hematology [facility] [address] [place], [place] [address] Tel. [phone]    Fax. [phone]   I, Jacqualyn Posey am acting as a Education administrator for Chauncey Cruel, MD.   I, Lurline Del MD, have reviewed the above documentation for accuracy and completeness, and I agree with the  above.

## 2019-06-10 ENCOUNTER — Inpatient Hospital Stay: Payer: 59

## 2019-06-10 ENCOUNTER — Encounter: Payer: Self-pay | Admitting: Physical Therapy

## 2019-06-10 ENCOUNTER — Ambulatory Visit
Admission: RE | Admit: 2019-06-10 | Discharge: 2019-06-10 | Disposition: A | Discharge status: Home / Self Care | Payer: 59 | Source: Ambulatory Visit | Attending: Radiation Oncology | Admitting: Radiation Oncology

## 2019-06-10 ENCOUNTER — Other Ambulatory Visit: Payer: Self-pay

## 2019-06-10 ENCOUNTER — Encounter: Payer: Self-pay | Admitting: Oncology

## 2019-06-10 ENCOUNTER — Inpatient Hospital Stay (HOSPITAL_BASED_OUTPATIENT_CLINIC_OR_DEPARTMENT_OTHER): Payer: 59 | Admitting: Oncology

## 2019-06-10 ENCOUNTER — Ambulatory Visit: Payer: 59 | Attending: General Surgery | Admitting: Physical Therapy

## 2019-06-10 ENCOUNTER — Other Ambulatory Visit: Payer: Self-pay | Admitting: General Surgery

## 2019-06-10 VITALS — BP 117/51 | HR 71 | Temp 98.0°F | Resp 18 | Ht 60.0 in | Wt 154.1 lb

## 2019-06-10 DIAGNOSIS — Z170 Estrogen receptor positive status [ER+]: Secondary | ICD-10-CM

## 2019-06-10 DIAGNOSIS — C50411 Malignant neoplasm of upper-outer quadrant of right female breast: Secondary | ICD-10-CM

## 2019-06-10 DIAGNOSIS — R293 Abnormal posture: Secondary | ICD-10-CM | POA: Insufficient documentation

## 2019-06-10 DIAGNOSIS — K5000 Crohn's disease of small intestine without complications: Secondary | ICD-10-CM

## 2019-06-10 LAB — CBC WITH DIFFERENTIAL (CANCER CENTER ONLY)
Abs Immature Granulocytes: 0.03 10*3/uL (ref 0.00–0.07)
Basophils Absolute: 0 10*3/uL (ref 0.0–0.1)
Basophils Relative: 0 %
Eosinophils Absolute: 0.1 10*3/uL (ref 0.0–0.5)
Eosinophils Relative: 2 %
HCT: 41.3 % (ref 36.0–46.0)
Hemoglobin: 14 g/dL (ref 12.0–15.0)
Immature Granulocytes: 1 %
Lymphocytes Relative: 30 %
Lymphs Abs: 1.5 10*3/uL (ref 0.7–4.0)
MCH: 30.6 pg (ref 26.0–34.0)
MCHC: 33.9 g/dL (ref 30.0–36.0)
MCV: 90.4 fL (ref 80.0–100.0)
Monocytes Absolute: 0.4 10*3/uL (ref 0.1–1.0)
Monocytes Relative: 9 %
Neutro Abs: 2.9 10*3/uL (ref 1.7–7.7)
Neutrophils Relative %: 58 %
Platelet Count: 269 10*3/uL (ref 150–400)
RBC: 4.57 MIL/uL (ref 3.87–5.11)
RDW: 12.6 % (ref 11.5–15.5)
WBC Count: 4.9 10*3/uL (ref 4.0–10.5)
nRBC: 0 % (ref 0.0–0.2)

## 2019-06-10 LAB — CMP (CANCER CENTER ONLY)
ALT: 44 U/L (ref 0–44)
AST: 21 U/L (ref 15–41)
Albumin: 4.3 g/dL (ref 3.5–5.0)
Alkaline Phosphatase: 89 U/L (ref 38–126)
Anion gap: 11 (ref 5–15)
BUN: 11 mg/dL (ref 6–20)
CO2: 21 mmol/L — ABNORMAL LOW (ref 22–32)
Calcium: 9.1 mg/dL (ref 8.9–10.3)
Chloride: 108 mmol/L (ref 98–111)
Creatinine: 0.95 mg/dL (ref 0.44–1.00)
GFR, Est AFR Am: >60 mL/min (ref >60)
GFR, Estimated: >60 mL/min (ref >60)
Glucose, Bld: 102 mg/dL — ABNORMAL HIGH (ref 70–99)
Potassium: 3.6 mmol/L (ref 3.5–5.1)
Sodium: 140 mmol/L (ref 135–145)
Total Bilirubin: 1.4 mg/dL — ABNORMAL HIGH (ref 0.3–1.2)
Total Protein: 7.1 g/dL (ref 6.5–8.1)

## 2019-06-10 NOTE — Patient Instructions (Signed)

## 2019-06-10 NOTE — Therapy (Signed)
[place], [place], [address] Phone: [phone]   Fax:  [phone]  Physical Therapy Evaluation  Patient Details  Name: [name] MRN: [id number] Date of Birth: January 27, 1961 Referring Provider (PT): Dr. Fanny Skates   Encounter Date: 06/10/2019  PT End of Session - 06/10/19 1109    Visit Number  1    Number of Visits  2    Date for PT Re-Evaluation  08/05/19    PT Start Time  0937    PT Stop Time  0949   Also saw pt from 1040-1055 for a total of 27 minutes   PT Time Calculation (min)  12 min    Activity Tolerance  Patient tolerated treatment well    Behavior During Therapy  [facility] for tasks assessed/performed       Past Medical History:  Diagnosis Date  . Allergy   . Anal fissure   . Anxiety   . Arthritis    -knees, hips- is bothersome now  . Breast cancer ([place]) 06/03/2019  . Colon polyps   . Crohn disease ([place])    10-27-15   . Delayed gastric emptying   . Depression   . GERD (gastroesophageal reflux disease)    occasional  . Graves disease    started with this  . H/O steroid therapy    presently on tapering doses of Prednisone, Entercort.  . Hypothyroidism    current  . IBS (irritable bowel syndrome)   . Osteopenia   . Vitamin B12 deficiency    remains under tx.  injections every 4 weeks-today a little more that 4 weeks.    Past Surgical History:  Procedure Laterality Date  . ABDOMINAL HYSTERECTOMY  2004   complete  . APPENDECTOMY  2004  . BOWEL RESECTION  2004   Crohns  . COLONOSCOPY    . COLONOSCOPY W/ POLYPECTOMY    . COLONOSCOPY WITH PROPOFOL N/A 11/01/2015   Procedure: COLONOSCOPY WITH PROPOFOL;  Surgeon: Garlan Fair, MD;  Location: [facility];  Service: Endoscopy;  Laterality: N/A;  . POLYPECTOMY    . radioiodine therapy  2003  . SMALL INTESTINE SURGERY  01/2019  . TEMPOROMANDIBULAR JOINT SURGERY Left 1990  . TUBAL LIGATION  1989  . UMBILICAL HERNIA  REPAIR N/A 08/06/2013   Procedure: HERNIA REPAIR UMBILICAL ADULT;  Surgeon: Odis Hollingshead, MD;  Location: [facility];  Service: General;  Laterality: N/A;  WITH MESH  . UPPER GASTROINTESTINAL ENDOSCOPY      There were no vitals filed for this visit.   Subjective Assessment - 06/10/19 1101    Subjective  Patient reports she is here today to be seen by her medical team for her newly diagnosed right breast cancer.    Pertinent History  Patient was diagnosed on 05/20/2019 with right grade II invasive ductal carcinoma breast cancer. It measures 1.5 cm and is located in the upper outer quadrant. It is ER/PR positive and HER2 negative with a Ki67 of 20%. She has a history of a bowel resection in February 2020 due to Crohn's disease.    Patient Stated Goals  Reduce lymphedema risk and learn post op shoulder ROM HEP    Currently in Pain?  Yes    Pain Score  5     Pain Location  Back    Pain Orientation  Lower    Pain Descriptors / Indicators  Aching    Pain Type  Chronic pain    Pain Onset  More than a month ago    Pain Frequency  Intermittent    Aggravating Factors   Being in 1 position for a long time    Pain Relieving Factors  Heat         [facility] PT Assessment - 06/10/19 0001      Assessment   Medical Diagnosis  Right breast cancer    Referring Provider (PT)  Dr. Fanny Skates    Onset Date/Surgical Date  05/20/19    Hand Dominance  Right    Prior Therapy  none      Precautions   Precautions  Other (comment)    Precaution Comments  Active cancer; Crohn's disease      Restrictions   Weight Bearing Restrictions  No      Balance Screen   Has the patient fallen in the past 6 months  No    Has the patient had a decrease in activity level because of a fear of falling?   No    Is the patient reluctant to leave their home because of a fear of falling?   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    Leisure  She does not exercise      Cognition   Overall Cognitive Status  Within Functional Limits for tasks assessed      Posture/Postural Control   Posture/Postural Control  Postural limitations    Postural Limitations  Rounded Shoulders;Forward head      ROM / Strength   AROM / PROM / Strength  AROM;Strength      AROM   Overall AROM Comments  Cervical AROM is WNL with c/o tightness due to stress    AROM Assessment Site  Shoulder;Cervical    Right/Left Shoulder  Right;Left    Right Shoulder Extension  46 Degrees    Right Shoulder Flexion  136 Degrees    Right Shoulder ABduction  152 Degrees    Right Shoulder Internal Rotation  60 Degrees    Right Shoulder External Rotation  90 Degrees    Left Shoulder Extension  56 Degrees    Left Shoulder Flexion  141 Degrees    Left Shoulder ABduction  152 Degrees    Left Shoulder Internal Rotation  63 Degrees    Left Shoulder External Rotation  84 Degrees    Cervical Flexion  WNL    Cervical Extension  WNL    Cervical - Right Side Bend  WNL    Cervical - Left Side Bend  WNL    Cervical - Right Rotation  WNL    Cervical - Left Rotation  WNL      Strength   Overall Strength  Within functional limits for tasks performed        LYMPHEDEMA/ONCOLOGY QUESTIONNAIRE - 06/10/19 1106      Type   Cancer Type  Right breast cancer      Lymphedema Assessments   Lymphedema Assessments  Upper extremities      Right Upper Extremity Lymphedema   10 cm Proximal to Olecranon Process  31.2 cm    Olecranon Process  24.3 cm    10 cm Proximal  to Ulnar Styloid Process  22.6 cm    Just Proximal to Ulnar Styloid Process  15.1 cm    Across Hand at [employer]  17.7 cm    At [place] of 2nd Digit  5.8 cm      Left Upper Extremity Lymphedema   10 cm Proximal to  Olecranon Process  31 cm    Olecranon Process  24.8 cm    10 cm Proximal to Ulnar Styloid Process  22.9 cm    Just Proximal to Ulnar Styloid Process  15.7 cm    Across Hand at [employer]  17.8 cm    At [place] of 2nd Digit  5.6 cm          Quick Dash - 06/10/19 0001    Open a tight or new jar  No difficulty    Do heavy household chores (wash walls, wash floors)  No difficulty    Carry a shopping bag or briefcase  No difficulty    Wash your back  No difficulty    Use a knife to cut food  No difficulty    Recreational activities in which you take some force or impact through your arm, shoulder, or hand (golf, hammering, tennis)  No difficulty    During the past week, to what extent has your arm, shoulder or hand problem interfered with your normal social activities with family, friends, neighbors, or groups?  Not at all    During the past week, to what extent has your arm, shoulder or hand problem limited your work or other regular daily activities  Not at all    Arm, shoulder, or hand pain.  None    Tingling (pins and needles) in your arm, shoulder, or hand  None    Difficulty Sleeping  No difficulty    DASH Score  0 %        Objective measurements completed on examination: See above findings.        Patient was instructed today in a home exercise program today for post op shoulder range of motion. These included active assist shoulder flexion in sitting, scapular retraction, wall walking with shoulder abduction, and hands behind head external rotation.  She was encouraged to do these twice a day, holding 3 seconds and repeating 5 times when permitted by her physician.          PT Education - 06/10/19 1108    Education Details  Lymphedema risk reduction and post op shoulder ROM HEP    Person(s) Educated  Patient    Methods  Explanation;Demonstration;Handout    Comprehension  Returned demonstration;Verbalized understanding          PT Long Term Goals - 06/10/19  1146      PT LONG TERM GOAL #1   Title  Patient will demonstrate she has regained full shoulder ROM and function post operatively compared to baselines.    Time  8    Period  Weeks    Status  New    Target Date  08/05/19      Breast Clinic Goals - 06/10/19 1146      Patient will be able to verbalize understanding of pertinent lymphedema risk reduction practices relevant to her diagnosis specifically related to skin care.   Time  1    Period  Days    Status  Achieved      Patient will be able to return demonstrate and/or verbalize understanding of the post-op home exercise program  related to regaining shoulder range of motion.   Time  1    Period  Days    Status  Achieved      Patient will be able to verbalize understanding of the importance of attending the postoperative After Breast Cancer Class for further lymphedema risk reduction education and therapeutic exercise.   Time  1    Period  Days    Status  Achieved            Plan - 06/10/19 1113    Clinical Impression Statement  Patient was diagnosed on 05/20/2019 with right grade II invasive ductal carcinoma breast cancer. It measures 1.5 cm and is located in the upper outer quadrant. It is ER/PR positive and HER2 negative with a Ki67 of 20%. She has a history of a bowel resection in February 2020 due to Crohn's disease. Her multidisciplinary medical team met prior to her assessments ot determine a recommended treatment plan. She is planning to have a right lumpectomy and sentinel node biopsy followed by Oncotype testing, radiation, and anti-estrogen therapy. She will benefit from a post op PT assessment to determine needs.    Stability/Clinical Decision Making  Stable/Uncomplicated    Clinical Decision Making  Low    Rehab Potential  Excellent    PT Frequency  --   Eval and 1 f/u visit   PT Treatment/Interventions  ADLs/Self Care Home Management;Patient/family education;Therapeutic exercise    PT Next Visit Plan  Will  reassess 3-4 weeks post op to determine needs    PT Home Exercise Plan  Post op shoulder ROM HEP    Consulted and Agree with Plan of Care  Patient       Patient will benefit from skilled therapeutic intervention in order to improve the following deficits and impairments:  Pain, Impaired UE functional use, Decreased knowledge of precautions, Postural dysfunction, Decreased range of motion  Visit Diagnosis: 1. Malignant neoplasm of upper-outer quadrant of right breast in female, estrogen receptor positive ([place])   2. Abnormal posture      Patient will follow up at outpatient cancer rehab 3-4 weeks following surgery.  If the patient requires physical therapy at that time, a specific plan will be dictated and sent to the referring physician for approval. The patient was educated today on appropriate basic range of motion exercises to begin post operatively and the importance of attending the After Breast Cancer class following surgery.  Patient was educated today on lymphedema risk reduction practices as it pertains to recommendations that will benefit the patient immediately following surgery.  She verbalized good understanding.     Problem List Patient Active Problem List   Diagnosis Date Noted  . Malignant neoplasm of upper-outer quadrant of right breast in female, estrogen receptor positive ([place]) 06/05/2019  . Multiple thyroid nodules 11/02/2018  . 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 Crohn's disease of small intestine without complication ([place]) 51/01/5851    Annia Friendly, PT 06/10/19 11:50 AM   [place], [place], [address] Phone: [phone]   Fax:  [phone]  Name: Suleyma Wafer MRN: [id number] Date of Birth: 01/18/1961

## 2019-06-11 ENCOUNTER — Telehealth: Payer: Self-pay | Admitting: Oncology

## 2019-06-11 NOTE — Telephone Encounter (Signed)
I talk with patient regarding schedule, patient is moving second week in aug she requested 8/6 but was unsure of surgery date

## 2019-06-16 ENCOUNTER — Other Ambulatory Visit: Payer: Self-pay | Admitting: General Surgery

## 2019-06-16 DIAGNOSIS — C50411 Malignant neoplasm of upper-outer quadrant of right female breast: Secondary | ICD-10-CM

## 2019-06-18 ENCOUNTER — Telehealth: Payer: Self-pay | Admitting: *Deleted

## 2019-06-18 ENCOUNTER — Telehealth: Payer: Self-pay | Admitting: Oncology

## 2019-06-18 NOTE — Telephone Encounter (Signed)
Spoke to pt concerning [place] from 7.1.20. Denies questions or concerns regarding dx or treatment care plan. Encourage pt to call with needs. Received verbal understanding. Contact information provided. Confirmed future appts.

## 2019-06-18 NOTE — Telephone Encounter (Signed)
Scheduled appt per 7/09 sch message - pt aware of appt date and item

## 2019-06-22 ENCOUNTER — Encounter: Payer: Self-pay | Admitting: *Deleted

## 2019-06-22 NOTE — Progress Notes (Signed)
Clinical Social Work [place] Psychosocial Distress Screening [place]  Patient completed distress screening protocol and scored a 5 on the Psychosocial Distress Thermometer which indicates moderate distress. Clinical Social Worker contacted patient at home after [facility] to assess for distress and other psychosocial needs. Patient stated she was feeling overwhelmed but felt "better" after meeting with the treatment team and getting more information on her treatment plan. CSW and patient discussed common feeling and emotions when being diagnosed with cancer, and the importance of support during treatment. CSW informed patient of the support team and support services at [facility]. CSW provided contact information and encouraged patient to call with any questions or concerns.  ONCBCN DISTRESS SCREENING 06/22/2019  Screening Type Initial Screening  Distress experienced in past week (1-10) 5  Practical problem type Housing  Emotional problem type Nervousness/Anxiety;Isolation/feeling alone  Physical Problem type Pain;Sleep/insomnia  Physician notified of physical symptoms Yes     Johnnye Lana, MSW, LCSW, OSW-C Clinical Social Worker [place] [phone]

## 2019-06-25 NOTE — H&P (Signed)
[name] Location: [facility] Patient #: [id number] DOB: 1961/10/23 Married / Language: English / Race: White Female       History of Present Illness        This is a very pleasant 58 year old female, referred by Dr. Dr. 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 She is seen in the [facility] today by Dr. Jana Hakim, Dr.Kinard and me. She is followed by Dr. Kelton Pillar at [facility] endocrinology and by Dr. Johnsie Cancel from cardiology. Nursing tech from the [facility] was present throughout as a chaperone.      She has no prior history of breast problems. Recent screening mammogram showing a 1.5 cm mass in the upper outer right breast with associated calcifications. 10 o'clock position. Posterior third, 6 cm from the nipple. Ultrasound of the right axilla is negative. 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 ER 100%. PR 100%. Ki-67 20%. HER-2 negative by fish.      Past history significant for Crohn's disease with 2 intestinal resections at [facility], one earlier this year. On azulfadine and questran, but no steroids. She's also had an appendectomy. History of Graves' disease, status post RIA 2002. On Synthroid. Multinodular goiter. Lower extremity edema evaluated by cardiology but no cardiac disease. Umbilical hernia repair. Family history is negative for breast or ovarian cancer. A paternal uncle died of colon cancer. Father deceased. Mother living with IBS. Social history reveals she is married with 3 children. Quit smoking in June, 2019, 1 year ago. Drinks alcohol occasionally. They live in [place] but they have bought a house in [place] and removing to [place] on August 11. She wants to have her surgery here. She is trying to decide about whether to transfer her medical and radiation oncology adjuvant therapies to [place].     We had a long discussion. I discussed  the stage of her cancer. We talked about lumpectomy, sentinel node and radiation therapy. We compared that to mastectomy with or without reconstruction. She is strongly in favor of breast conservation and I think she is a good candidate for that. She will be scheduled for right breast lumpectomy with radioactive seed localization, inject blue dye right breast, right axillary deep sentinel lymph node biopsy. I discussed the indications, details, techniques, and numerous risk of the surgery with her. She is aware of the risk of bleeding, infection, reoperation for positive margins or positive nodes, cosmetic deformity, chronic pain, shoulder disability. Arm swelling. Sensory deficit in the arm from the axillary surgery. She understands all of these issues. All of her questions are answered. She agrees with this plan.  Plan: Scheduled for surgery as outlined above Oncotype, with chemotherapy if high risk. Adjuvant radiation therapy, possibly in [place] Eventual antiestrogen therapy     Past Surgical History  Appendectomy  Breast Biopsy  Right. Colon Polyp Removal - Colonoscopy  Hysterectomy (not due to cancer) - Complete  Oral Surgery  Resection of Small Bowel  Ventral / Umbilical Hernia Surgery  Bilateral.  Diagnostic Studies History  Colonoscopy  within last year Mammogram  within last year Pap Smear  1-5 years ago  Medication History Medications Reconciled  Social History  Alcohol use  Moderate alcohol use. Caffeine use  Coffee. No drug use  Tobacco use  Former smoker.  Family History  Alcohol Abuse  Father. Depression  Family Members In General. Diabetes Mellitus  Family Members In General.  Pregnancy / Birth History  Age  at menarche  49 years. Age of menopause  <45 Gravida  3 Irregular periods  Length (months) of breastfeeding  3-6 Maternal age  8-20 Para  3  Other Problems Arthritis  Back Pain  Crohn's Disease  Other  disease, cancer, significant illness  Thyroid Disease     Review of Systems  General Not Present- Appetite Loss, Chills, Fatigue, Fever, Night Sweats, Weight Gain and Weight Loss. Skin Not Present- Change in Wart/Mole, Dryness, Hives, Jaundice, New Lesions, Non-Healing Wounds, Rash and Ulcer. HEENT Present- Seasonal Allergies. Not Present- Earache, Hearing Loss, Hoarseness, Nose Bleed, Oral Ulcers, Ringing in the Ears, Sinus Pain, Sore Throat, Visual Disturbances, Wears glasses/contact lenses and Yellow Eyes. Respiratory Not Present- Bloody sputum, Chronic Cough, Difficulty Breathing, Snoring and Wheezing. Breast Not Present- Breast Mass, Breast Pain, Nipple Discharge and Skin Changes. Cardiovascular Not Present- Chest Pain, Difficulty Breathing Lying Down, Leg Cramps, Palpitations, Rapid Heart Rate, Shortness of Breath and Swelling of Extremities. Gastrointestinal Not Present- Abdominal Pain, Bloating, Bloody Stool, Change in Bowel Habits, Chronic diarrhea, Constipation, Difficulty Swallowing, Excessive gas, Gets full quickly at meals, Hemorrhoids, Indigestion, Nausea, Rectal Pain and Vomiting. Female Genitourinary Not Present- Frequency, Nocturia, Painful Urination, Pelvic Pain and Urgency. Musculoskeletal Not Present- Back Pain, Joint Pain, Joint Stiffness, Muscle Pain, Muscle Weakness and Swelling of Extremities. Neurological Not Present- Decreased Memory, Fainting, Headaches, Numbness, Seizures, Tingling, Tremor, Trouble walking and Weakness. Psychiatric Present- Anxiety. Not Present- Bipolar, Change in Sleep Pattern, Depression, Fearful and Frequent crying. Endocrine Present- Hot flashes. Not Present- Cold Intolerance, Excessive Hunger, Hair Changes, Heat Intolerance and New Diabetes. Hematology Not Present- Blood Thinners, Easy Bruising, Excessive bleeding, Gland problems, HIV and Persistent Infections.   Physical Exam  General Mental Status-Alert. General Appearance-Consistent  with stated age. Hydration-Well hydrated. Voice-Normal.  Head and Neck Head-normocephalic, atraumatic with no lesions or palpable masses. Trachea-midline. Thyroid Gland Characteristics - normal size and consistency. Note: No adenopathy. Thyroid not enlarged.   Eye Eyeball - Bilateral-Extraocular movements intact. Sclera/Conjunctiva - Bilateral-No scleral icterus.  Chest and Lung Exam Chest and lung exam reveals -quiet, even and easy respiratory effort with no use of accessory muscles and on auscultation, normal breath sounds, no adventitious sounds and normal vocal resonance. Inspection Chest Wall - Normal. Back - normal.  Breast Note: Small bruise upper outer right breast. Otherwise breasts are symmetrical without skin change or palpable mass. There is no palpable axillary adenopathy. Medium to large size.   Cardiovascular Cardiovascular examination reveals -normal heart sounds, regular rate and rhythm with no murmurs and normal pedal pulses bilaterally.  Abdomen Inspection Inspection of the abdomen reveals - No Hernias. Skin - Scar - Note: Multiple scars. Pfannenstiel incision. Trocar sites on right and left. Transverse incision around the umbilicus. No hernias. No masses. Nontender. Palpation/Percussion Palpation and Percussion of the abdomen reveal - Soft, Non Tender, No Rebound tenderness, No Rigidity (guarding) and No hepatosplenomegaly. Auscultation Auscultation of the abdomen reveals - Bowel sounds normal.  Neurologic Neurologic evaluation reveals -alert and oriented x 3 with no impairment of recent or remote memory. Mental Status-Normal.  Musculoskeletal Normal Exam - Left-Upper Extremity Strength Normal and Lower Extremity Strength Normal. Normal Exam - Right-Upper Extremity Strength Normal and Lower Extremity Strength Normal.  Lymphatic Head & Neck  General Head & Neck Lymphatics: Bilateral - Description -  Normal. Axillary  General Axillary Region: Bilateral - Description - Normal. Tenderness - Non Tender. Femoral & Inguinal  Generalized Femoral & Inguinal Lymphatics: Bilateral - Description - Normal. Tenderness - Non Tender.  Assessment & Plan  PRIMARY CANCER OF UPPER OUTER QUADRANT OF RIGHT FEMALE BREAST (C50.411)   Your recent imaging studies and biopsies show a 1.5 cm mass in the right breast, upper outer quadrant Ultrasound of the right axilla shows normal lymph nodes Biopsy of the breast mass shows invasive ductal carcinoma, intermediate grade. Estrogen and progesterone receptors are strongly positive. HER-2 negative. Proliferation index 20%  You are advised to stop using the estradiol patch, since estrogen can stimulate your tumor to grow  We discussed surgical options as well as multidisciplinary care We talked about lumpectomy, sentinel node biopsy and radiation therapy and compared that to mastectomy with or without reconstruction You are in favor of breast conservation and I agree that you are a good candidate for that  The next step in your care is surgery you will be scheduled for right breast lumpectomy with radioactive seed localization, injection blue dye right breast, right axillary deep sentinel lymph node biopsy Dr. Dalbert Batman has discussed the indications, techniques, and risk of the surgery in detail with you.  Decisions about other forms of treatment such as radiation therapy, chemotherapy, antiestrogen pills will be made after the final surgical pathology is reviewed   CROHN'S DISEASE IN REMISSION (K50.90) Impression: Has had 2 separate laparotomies and bowel resections at [facility]. Takes azulfadine and Questran. Not on steroids.  HISTORY OF GRAVES' DISEASE (Z86.39) Impression: Status post RIA.. Takes Synthroid.  LOWER EXTREMITY EDEMA (R60.0) Impression: prior evaluation by Dr.Nishan.  HISTORY OF TOTAL ABDOMINAL HYSTERECTOMY AND BILATERAL  SALPINGO-OOPHORECTOMY (Z90.710)  HISTORY OF UMBILICAL HERNIA REPAIR (Z98.890)  HISTORY OF APPENDECTOMY (Z90.49)    [name]. Dalbert Batman, M.D., [facility] General and Minimally invasive Surgery Breast and Colorectal Surgery Office:   [phone] Pager:   [phone]

## 2019-06-29 NOTE — Progress Notes (Signed)
[id number] [place], [place] [id number]: [phone]: [phone]     Your procedure is scheduled on July 24  [facility] at 1130 A.M., and check in at the Admitting office.  Call this number if you have problems the morning of surgery:  [phone] Call [phone] you have any questions prior to your surgery date Monday-Friday 8am-4pm    Remember:  Do not eat after midnight the night before your surgery  You may drink clear liquids until 1030 the morning of your surgery.   Clear liquids allowed are: Water, Non-Citrus Juices (without pulp), Carbonated Beverages, Clear Tea, Black Coffee Only, and Gatorade    Take these medicines the morning of surgery with A SIP OF WATER  cetirizine (ZYRTEC) SYNTHROID   7 days prior to surgery STOP taking any Aspirin (unless otherwise instructed by your surgeon), Aleve, Naproxen, Ibuprofen, Motrin, Advil, Goody's, BC's, all herbal medications, fish oil, and all vitamins.    The Morning of Surgery  Do not wear jewelry, make-up or nail polish.  Do not wear lotions, powders, or perfumes/colognes, or deodorant  Do not shave 48 hours prior to surgery.  Men may shave face and neck.  Do not bring valuables to the hospital.  [facility] or [facility].  If you are a smoker, DO NOT Smoke 24 hours prior to surgery IF you wear a CPAP at night please bring your mask, tubing, and machine the morning of surgery   Remember that you must have someone to transport you home after your surgery, and remain with you for 24 hours if you are discharged the same day.   Contacts, glasses, hearing aids, dentures or bridgework may not be worn into surgery.    Leave your suitcase in the car.  After surgery it may be brought to your room.  For patients admitted to the hospital, discharge  time will be determined by your treatment team.  Patients discharged the day of surgery will not be allowed to drive home.    Special instructions:   [facility]- Preparing For Surgery  Before surgery, you can play an important role. Because skin is not sterile, your skin needs to be as free of germs as possible. You can reduce the number of germs on your skin by washing with CHG (chlorahexidine gluconate) Soap before surgery.  CHG is an antiseptic cleaner which kills germs and bonds with the skin to continue killing germs even after washing.    Oral Hygiene is also important to reduce your risk of infection.  Remember - BRUSH YOUR TEETH THE MORNING OF SURGERY WITH YOUR REGULAR TOOTHPASTE  Please do not use if you have an allergy to CHG or antibacterial soaps. If your skin becomes reddened/irritated stop using the CHG.  Do not shave (including legs and underarms) for at least 48 hours prior to first CHG shower. It is OK to shave your face.  Please follow these instructions carefully.   1. Shower the NIGHT BEFORE SURGERY and the MORNING OF SURGERY with CHG Soap.   2. If you chose to wash your hair, wash your hair first as usual with your normal shampoo.  3. After you shampoo, rinse your hair and body thoroughly to remove the shampoo.  4. Use CHG as you would any other liquid soap. You can apply CHG directly to the  skin and wash gently with a scrungie or a clean washcloth.   5. Apply the CHG Soap to your body ONLY FROM THE NECK DOWN.  Do not use on open wounds or open sores. Avoid contact with your eyes, ears, mouth and genitals (private parts). Wash Face and genitals (private parts)  with your normal soap.   6. Wash thoroughly, paying special attention to the area where your surgery will be performed.  7. Thoroughly rinse your body with warm water from the neck down.  8. DO NOT shower/wash with your normal soap after using and rinsing off the CHG Soap.  9. Pat yourself dry with a  CLEAN TOWEL.  10. Wear CLEAN PAJAMAS to bed the night before surgery, wear comfortable clothes the morning of surgery  11. Place CLEAN SHEETS on your bed the night of your first shower and DO NOT SLEEP WITH PETS.    Day of Surgery:  Do not apply any deodorants/lotions. Please shower the morning of surgery with the CHG soap  Please wear clean clothes to the hospital/surgery center.   Remember to brush your teeth WITH YOUR REGULAR TOOTHPASTE.   Please read over the following fact sheets that you were given.

## 2019-06-30 ENCOUNTER — Encounter (HOSPITAL_COMMUNITY)
Admission: RE | Admit: 2019-06-30 | Discharge: 2019-06-30 | Disposition: A | Discharge status: Home / Self Care | Payer: 59 | Source: Ambulatory Visit | Attending: General Surgery | Admitting: General Surgery

## 2019-06-30 ENCOUNTER — Other Ambulatory Visit: Payer: Self-pay

## 2019-06-30 ENCOUNTER — Encounter (HOSPITAL_COMMUNITY): Payer: Self-pay

## 2019-06-30 ENCOUNTER — Other Ambulatory Visit (HOSPITAL_COMMUNITY)
Admission: RE | Admit: 2019-06-30 | Discharge: 2019-06-30 | Disposition: A | Discharge status: Home / Self Care | Payer: 59 | Source: Ambulatory Visit | Attending: General Surgery | Admitting: General Surgery

## 2019-06-30 ENCOUNTER — Other Ambulatory Visit (HOSPITAL_COMMUNITY): Payer: 59

## 2019-06-30 DIAGNOSIS — Z01812 Encounter for preprocedural laboratory examination: Secondary | ICD-10-CM | POA: Insufficient documentation

## 2019-06-30 DIAGNOSIS — Z1159 Encounter for screening for other viral diseases: Secondary | ICD-10-CM | POA: Insufficient documentation

## 2019-06-30 HISTORY — DX: Family history of other specified conditions: Z84.89

## 2019-06-30 LAB — BASIC METABOLIC PANEL
Anion gap: 10 (ref 5–15)
BUN: 12 mg/dL (ref 6–20)
CO2: 22 mmol/L (ref 22–32)
Calcium: 9.3 mg/dL (ref 8.9–10.3)
Chloride: 106 mmol/L (ref 98–111)
Creatinine, Ser: 0.81 mg/dL (ref 0.44–1.00)
GFR calc Af Amer: >60 mL/min (ref >60)
GFR calc non Af Amer: >60 mL/min (ref >60)
Glucose, Bld: 97 mg/dL (ref 70–99)
Potassium: 4.1 mmol/L (ref 3.5–5.1)
Sodium: 138 mmol/L (ref 135–145)

## 2019-06-30 LAB — CBC
HCT: 43.4 % (ref 36.0–46.0)
Hemoglobin: 14.6 g/dL (ref 12.0–15.0)
MCH: 31.5 pg (ref 26.0–34.0)
MCHC: 33.6 g/dL (ref 30.0–36.0)
MCV: 93.7 fL (ref 80.0–100.0)
Platelets: 282 10*3/uL (ref 150–400)
RBC: 4.63 MIL/uL (ref 3.87–5.11)
RDW: 12.5 % (ref 11.5–15.5)
WBC: 6.8 10*3/uL (ref 4.0–10.5)
nRBC: 0 % (ref 0.0–0.2)

## 2019-06-30 LAB — SARS CORONAVIRUS 2 (TAT 6-24 HRS): SARS Coronavirus 2: NEGATIVE

## 2019-06-30 NOTE — Progress Notes (Signed)
PCP - Emi [place] Cardiologist - Dr. Johnsie Cancel  Chest x-ray - N/A EKG - 11/17/18 Stress Test - denies ECHO - 12/04/18 Cardiac Cath - denies  Sleep Study - denies   Aspirin Instructions: Patient instructed to hold all Aspirin, NSAID's, herbal medications, fish oil and vitamins 7 days prior to surgery.   Anesthesia review:   Patient denies shortness of breath, fever, cough and chest pain at PAT appointment   Patient verbalized understanding of instructions that were given to them at the PAT appointment. Patient was also instructed that they will need to review over the PAT instructions again at home before surgery.

## 2019-07-02 ENCOUNTER — Other Ambulatory Visit: Payer: Self-pay

## 2019-07-02 ENCOUNTER — Ambulatory Visit
Admission: RE | Admit: 2019-07-02 | Discharge: 2019-07-02 | Disposition: A | Discharge status: Home / Self Care | Payer: 59 | Source: Ambulatory Visit | Attending: General Surgery | Admitting: General Surgery

## 2019-07-02 DIAGNOSIS — C50411 Malignant neoplasm of upper-outer quadrant of right female breast: Secondary | ICD-10-CM

## 2019-07-02 DIAGNOSIS — Z170 Estrogen receptor positive status [ER+]: Secondary | ICD-10-CM

## 2019-07-03 ENCOUNTER — Other Ambulatory Visit: Payer: Self-pay

## 2019-07-03 ENCOUNTER — Ambulatory Visit (HOSPITAL_COMMUNITY)
Admission: RE | Admit: 2019-07-03 | Discharge: 2019-07-03 | Disposition: A | Discharge status: Home / Self Care | Payer: 59 | Attending: General Surgery | Admitting: General Surgery

## 2019-07-03 ENCOUNTER — Ambulatory Visit (HOSPITAL_COMMUNITY): Payer: 59 | Admitting: Anesthesiology

## 2019-07-03 ENCOUNTER — Encounter (HOSPITAL_COMMUNITY): Payer: 59

## 2019-07-03 ENCOUNTER — Ambulatory Visit (HOSPITAL_COMMUNITY)
Admission: RE | Admit: 2019-07-03 | Discharge: 2019-07-03 | Disposition: A | Discharge status: Home / Self Care | Payer: 59 | Source: Ambulatory Visit | Attending: General Surgery | Admitting: General Surgery

## 2019-07-03 ENCOUNTER — Encounter (HOSPITAL_COMMUNITY): Payer: Self-pay | Admitting: *Deleted

## 2019-07-03 ENCOUNTER — Ambulatory Visit
Admission: RE | Admit: 2019-07-03 | Discharge: 2019-07-03 | Disposition: A | Discharge status: Home / Self Care | Payer: 59 | Source: Ambulatory Visit | Attending: General Surgery | Admitting: General Surgery

## 2019-07-03 ENCOUNTER — Encounter (HOSPITAL_COMMUNITY)
Admission: RE | Disposition: A | Discharge status: Home / Self Care | Payer: Self-pay | Source: Home / Self Care | Attending: General Surgery

## 2019-07-03 DIAGNOSIS — E042 Nontoxic multinodular goiter: Secondary | ICD-10-CM | POA: Diagnosis not present

## 2019-07-03 DIAGNOSIS — M1990 Unspecified osteoarthritis, unspecified site: Secondary | ICD-10-CM | POA: Insufficient documentation

## 2019-07-03 DIAGNOSIS — Z87891 Personal history of nicotine dependence: Secondary | ICD-10-CM | POA: Diagnosis not present

## 2019-07-03 DIAGNOSIS — Z79890 Hormone replacement therapy (postmenopausal): Secondary | ICD-10-CM | POA: Insufficient documentation

## 2019-07-03 DIAGNOSIS — C50411 Malignant neoplasm of upper-outer quadrant of right female breast: Secondary | ICD-10-CM

## 2019-07-03 DIAGNOSIS — Z170 Estrogen receptor positive status [ER+]: Secondary | ICD-10-CM

## 2019-07-03 DIAGNOSIS — K5090 Crohn's disease, unspecified, without complications: Secondary | ICD-10-CM | POA: Insufficient documentation

## 2019-07-03 DIAGNOSIS — E039 Hypothyroidism, unspecified: Secondary | ICD-10-CM | POA: Insufficient documentation

## 2019-07-03 HISTORY — PX: BREAST LUMPECTOMY WITH RADIOACTIVE SEED AND SENTINEL LYMPH NODE BIOPSY: SHX6550

## 2019-07-03 SURGERY — BREAST LUMPECTOMY WITH RADIOACTIVE SEED AND SENTINEL LYMPH NODE BIOPSY
Anesthesia: General | Site: Breast | Laterality: Right

## 2019-07-03 MED ORDER — 0.9 % SODIUM CHLORIDE (POUR BTL) OPTIME
TOPICAL | Status: DC | PRN
  Administered 2019-07-03: 1000 mL

## 2019-07-03 MED ORDER — LACTATED RINGERS IV SOLN
INTRAVENOUS | Status: DC
  Administered 2019-07-03: 09:00:00 via INTRAVENOUS

## 2019-07-03 MED ORDER — OXYCODONE HCL 5 MG PO TABS
5.0000 mg | ORAL_TABLET | ORAL | Status: DC | PRN
  Administered 2019-07-03: 13:00:00 10 mg via ORAL
  Filled 2019-07-03: qty 2

## 2019-07-03 MED ORDER — SODIUM CHLORIDE (PF) 0.9 % IJ SOLN
INTRAMUSCULAR | Status: AC
  Filled 2019-07-03: qty 10

## 2019-07-03 MED ORDER — PROMETHAZINE HCL 25 MG/ML IJ SOLN: 6.2500 mg | INTRAMUSCULAR | Status: DC | PRN

## 2019-07-03 MED ORDER — DEXAMETHASONE SODIUM PHOSPHATE 10 MG/ML IJ SOLN
INTRAMUSCULAR | Status: DC | PRN
  Administered 2019-07-03: 10 mg via INTRAVENOUS

## 2019-07-03 MED ORDER — FENTANYL CITRATE (PF) 250 MCG/5ML IJ SOLN
INTRAMUSCULAR | Status: DC | PRN
  Administered 2019-07-03: 50 ug via INTRAVENOUS

## 2019-07-03 MED ORDER — FENTANYL CITRATE (PF) 100 MCG/2ML IJ SOLN
25.0000 ug | INTRAMUSCULAR | Status: DC | PRN
  Administered 2019-07-03 (×2): 50 ug via INTRAVENOUS

## 2019-07-03 MED ORDER — BUPIVACAINE-EPINEPHRINE 0.25% -1:200000 IJ SOLN
INTRAMUSCULAR | Status: DC | PRN
  Administered 2019-07-03: 10 mL

## 2019-07-03 MED ORDER — SODIUM CHLORIDE 0.9% FLUSH: 3.0000 mL | Freq: Two times a day (BID) | INTRAVENOUS | Status: DC

## 2019-07-03 MED ORDER — METHYLENE BLUE 0.5 % INJ SOLN
INTRAVENOUS | Status: AC
  Filled 2019-07-03: qty 10

## 2019-07-03 MED ORDER — OXYCODONE HCL 5 MG PO TABS
ORAL_TABLET | ORAL | Status: AC
  Filled 2019-07-03: qty 2

## 2019-07-03 MED ORDER — FENTANYL CITRATE (PF) 100 MCG/2ML IJ SOLN
100.0000 ug | Freq: Once | INTRAMUSCULAR | Status: AC
  Administered 2019-07-03: 10:00:00 100 ug via INTRAVENOUS

## 2019-07-03 MED ORDER — BUPIVACAINE HCL (PF) 0.5 % IJ SOLN
INTRAMUSCULAR | Status: DC | PRN
  Administered 2019-07-03: 15 mL

## 2019-07-03 MED ORDER — FENTANYL CITRATE (PF) 100 MCG/2ML IJ SOLN: 25.0000 ug | INTRAMUSCULAR | Status: DC | PRN

## 2019-07-03 MED ORDER — FENTANYL CITRATE (PF) 100 MCG/2ML IJ SOLN
INTRAMUSCULAR | Status: AC
  Administered 2019-07-03: 10:00:00 50 ug via INTRAVENOUS
  Filled 2019-07-03: qty 2

## 2019-07-03 MED ORDER — ACETAMINOPHEN 500 MG PO TABS: 1000.0000 mg | ORAL_TABLET | Freq: Four times a day (QID) | ORAL | Status: DC

## 2019-07-03 MED ORDER — OXYCODONE HCL 5 MG/5ML PO SOLN: 5.0000 mg | Freq: Once | ORAL | Status: DC | PRN

## 2019-07-03 MED ORDER — PROPOFOL 10 MG/ML IV BOLUS
INTRAVENOUS | Status: DC | PRN
  Administered 2019-07-03: 200 mg via INTRAVENOUS

## 2019-07-03 MED ORDER — CELECOXIB 200 MG PO CAPS
200.0000 mg | ORAL_CAPSULE | ORAL | Status: AC
  Administered 2019-07-03: 200 mg via ORAL
  Filled 2019-07-03: qty 1

## 2019-07-03 MED ORDER — MIDAZOLAM HCL 2 MG/2ML IJ SOLN
2.0000 mg | Freq: Once | INTRAMUSCULAR | Status: AC
  Administered 2019-07-03: 2 mg via INTRAVENOUS

## 2019-07-03 MED ORDER — BUPIVACAINE LIPOSOME 1.3 % IJ SUSP
INTRAMUSCULAR | Status: DC | PRN
  Administered 2019-07-03: 10 mL

## 2019-07-03 MED ORDER — ACETAMINOPHEN 650 MG RE SUPP: 650.0000 mg | RECTAL | Status: DC | PRN

## 2019-07-03 MED ORDER — ONDANSETRON HCL 4 MG/2ML IJ SOLN
INTRAMUSCULAR | Status: AC
  Filled 2019-07-03: qty 2

## 2019-07-03 MED ORDER — TECHNETIUM TC 99M SULFUR COLLOID FILTERED
1.0000 | Freq: Once | INTRAVENOUS | Status: AC | PRN
  Administered 2019-07-03: 10:00:00 1 via INTRADERMAL

## 2019-07-03 MED ORDER — SODIUM CHLORIDE (PF) 0.9 % IJ SOLN
INTRAVENOUS | Status: DC | PRN
  Administered 2019-07-03: 11:00:00 5 mL via INTRAMUSCULAR

## 2019-07-03 MED ORDER — SODIUM CHLORIDE 0.9% FLUSH: 3.0000 mL | INTRAVENOUS | Status: DC | PRN

## 2019-07-03 MED ORDER — FENTANYL CITRATE (PF) 100 MCG/2ML IJ SOLN
INTRAMUSCULAR | Status: AC
  Administered 2019-07-03: 10:00:00 100 ug via INTRAVENOUS
  Filled 2019-07-03: qty 2

## 2019-07-03 MED ORDER — FENTANYL CITRATE (PF) 100 MCG/2ML IJ SOLN
50.0000 ug | Freq: Once | INTRAMUSCULAR | Status: AC
  Administered 2019-07-03: 10:00:00 50 ug via INTRAVENOUS

## 2019-07-03 MED ORDER — ONDANSETRON HCL 4 MG/2ML IJ SOLN
INTRAMUSCULAR | Status: DC | PRN
  Administered 2019-07-03: 4 mg via INTRAVENOUS

## 2019-07-03 MED ORDER — HYDROCODONE-ACETAMINOPHEN 5-325 MG PO TABS: 1.0000 | ORAL_TABLET | Freq: Four times a day (QID) | ORAL | 0 refills | Status: DC | PRN

## 2019-07-03 MED ORDER — CEFAZOLIN SODIUM-DEXTROSE 2-4 GM/100ML-% IV SOLN
2.0000 g | INTRAVENOUS | Status: AC
  Administered 2019-07-03: 2 g via INTRAVENOUS
  Filled 2019-07-03: qty 100

## 2019-07-03 MED ORDER — EPHEDRINE SULFATE 50 MG/ML IJ SOLN
INTRAMUSCULAR | Status: DC | PRN
  Administered 2019-07-03: 5 mg via INTRAVENOUS
  Administered 2019-07-03 (×2): 10 mg via INTRAVENOUS

## 2019-07-03 MED ORDER — MIDAZOLAM HCL 2 MG/2ML IJ SOLN
INTRAMUSCULAR | Status: AC
  Administered 2019-07-03: 10:00:00 1 mg via INTRAVENOUS
  Filled 2019-07-03: qty 2

## 2019-07-03 MED ORDER — FENTANYL CITRATE (PF) 100 MCG/2ML IJ SOLN
INTRAMUSCULAR | Status: AC
  Filled 2019-07-03: qty 2

## 2019-07-03 MED ORDER — PROPOFOL 10 MG/ML IV BOLUS
INTRAVENOUS | Status: AC
  Filled 2019-07-03: qty 20

## 2019-07-03 MED ORDER — ACETAMINOPHEN 500 MG PO TABS
1000.0000 mg | ORAL_TABLET | ORAL | Status: AC
  Administered 2019-07-03: 09:00:00 1000 mg via ORAL
  Filled 2019-07-03: qty 2

## 2019-07-03 MED ORDER — LIDOCAINE 2% (20 MG/ML) 5 ML SYRINGE
INTRAMUSCULAR | Status: DC | PRN
  Administered 2019-07-03: 40 mg via INTRAVENOUS

## 2019-07-03 MED ORDER — BUPIVACAINE-EPINEPHRINE (PF) 0.25% -1:200000 IJ SOLN
INTRAMUSCULAR | Status: AC
  Filled 2019-07-03: qty 30

## 2019-07-03 MED ORDER — SODIUM CHLORIDE 0.9 % IV SOLN: 250.0000 mL | INTRAVENOUS | Status: DC | PRN

## 2019-07-03 MED ORDER — GABAPENTIN 300 MG PO CAPS
300.0000 mg | ORAL_CAPSULE | ORAL | Status: AC
  Administered 2019-07-03: 09:00:00 300 mg via ORAL
  Filled 2019-07-03: qty 1

## 2019-07-03 MED ORDER — LACTATED RINGERS IV SOLN: INTRAVENOUS | Status: DC

## 2019-07-03 MED ORDER — ACETAMINOPHEN 325 MG PO TABS: 650.0000 mg | ORAL_TABLET | ORAL | Status: DC | PRN

## 2019-07-03 MED ORDER — CHLORHEXIDINE GLUCONATE CLOTH 2 % EX PADS: 6.0000 | MEDICATED_PAD | Freq: Once | CUTANEOUS | Status: DC

## 2019-07-03 MED ORDER — MIDAZOLAM HCL 2 MG/2ML IJ SOLN
INTRAMUSCULAR | Status: AC
  Administered 2019-07-03: 09:00:00 2 mg via INTRAVENOUS
  Filled 2019-07-03: qty 2

## 2019-07-03 MED ORDER — OXYCODONE HCL 5 MG PO TABS: 5.0000 mg | ORAL_TABLET | Freq: Once | ORAL | Status: DC | PRN

## 2019-07-03 MED ORDER — FENTANYL CITRATE (PF) 250 MCG/5ML IJ SOLN
INTRAMUSCULAR | Status: AC
  Filled 2019-07-03: qty 5

## 2019-07-03 MED ORDER — MIDAZOLAM HCL 2 MG/2ML IJ SOLN
1.0000 mg | Freq: Once | INTRAMUSCULAR | Status: AC
  Administered 2019-07-03: 10:00:00 1 mg via INTRAVENOUS

## 2019-07-03 SURGICAL SUPPLY — 43 items
ADH SKN CLS APL DERMABOND .7 (GAUZE/BANDAGES/DRESSINGS) ×1
APL PRP STRL LF DISP 70% ISPRP (MISCELLANEOUS) ×1
APPLIER CLIP 9.375 MED OPEN (MISCELLANEOUS) ×2
APR CLP MED 9.3 20 MLT OPN (MISCELLANEOUS) ×1
BINDER BREAST XLRG (GAUZE/BANDAGES/DRESSINGS) ×2 IMPLANT
CANISTER SUCT 3000ML PPV (MISCELLANEOUS) ×2 IMPLANT
CHLORAPREP W/TINT 26 (MISCELLANEOUS) ×2 IMPLANT
CLIP APPLIE 9.375 MED OPEN (MISCELLANEOUS) ×1 IMPLANT
CONT SPEC 4OZ CLIKSEAL STRL BL (MISCELLANEOUS) ×2 IMPLANT
COVER PROBE W GEL 5X96 (DRAPES) ×2 IMPLANT
COVER SURGICAL LIGHT HANDLE (MISCELLANEOUS) ×2 IMPLANT
COVER WAND RF STERILE (DRAPES) ×2 IMPLANT
DERMABOND ADVANCED (GAUZE/BANDAGES/DRESSINGS) ×1
DERMABOND ADVANCED .7 DNX12 (GAUZE/BANDAGES/DRESSINGS) ×1 IMPLANT
DEVICE DUBIN SPECIMEN MAMMOGRA (MISCELLANEOUS) ×2 IMPLANT
DRAPE CHEST BREAST 15X10 FENES (DRAPES) ×2 IMPLANT
DRAPE HALF SHEET 40X57 (DRAPES) ×2 IMPLANT
DRSG PAD ABDOMINAL 8X10 ST (GAUZE/BANDAGES/DRESSINGS) ×2 IMPLANT
ELECT CAUTERY BLADE 6.4 (BLADE) ×2 IMPLANT
ELECT REM PT RETURN 9FT ADLT (ELECTROSURGICAL) ×2
ELECTRODE REM PT RTRN 9FT ADLT (ELECTROSURGICAL) ×1 IMPLANT
FILTER STRAW FLUID ASPIR (MISCELLANEOUS) ×1 IMPLANT
GAUZE SPONGE 4X4 12PLY STRL (GAUZE/BANDAGES/DRESSINGS) ×2 IMPLANT
GLOVE EUDERMIC 7 POWDERFREE (GLOVE) ×2 IMPLANT
GOWN STRL REUS W/ TWL LRG LVL3 (GOWN DISPOSABLE) ×1 IMPLANT
GOWN STRL REUS W/ TWL XL LVL3 (GOWN DISPOSABLE) ×1 IMPLANT
GOWN STRL REUS W/TWL LRG LVL3 (GOWN DISPOSABLE) ×2
GOWN STRL REUS W/TWL XL LVL3 (GOWN DISPOSABLE) ×2
KIT BASIN OR (CUSTOM PROCEDURE TRAY) ×2 IMPLANT
KIT MARKER MARGIN INK (KITS) ×2 IMPLANT
NDL 18GX1X1/2 (RX/OR ONLY) (NEEDLE) IMPLANT
NDL HYPO 25GX1X1/2 BEV (NEEDLE) ×1 IMPLANT
NEEDLE 18GX1X1/2 (RX/OR ONLY) (NEEDLE) ×2 IMPLANT
NEEDLE HYPO 25GX1X1/2 BEV (NEEDLE) ×2 IMPLANT
NS IRRIG 1000ML POUR BTL (IV SOLUTION) ×2 IMPLANT
PACK GENERAL/GYN (CUSTOM PROCEDURE TRAY) ×2 IMPLANT
SPONGE LAP 4X18 RFD (DISPOSABLE) ×2 IMPLANT
SUT MNCRL AB 4-0 PS2 18 (SUTURE) ×4 IMPLANT
SUT SILK 2 0 SH (SUTURE) ×2 IMPLANT
SUT VIC AB 3-0 SH 18 (SUTURE) ×2 IMPLANT
SYR CONTROL 10ML LL (SYRINGE) ×2 IMPLANT
TOWEL GREEN STERILE (TOWEL DISPOSABLE) ×2 IMPLANT
TOWEL GREEN STERILE FF (TOWEL DISPOSABLE) ×2 IMPLANT

## 2019-07-03 NOTE — Anesthesia Procedure Notes (Signed)
Anesthesia Regional Block: Pectoralis block   Pre-Anesthetic Checklist: ,, timeout performed, Correct Patient, Correct Site, Correct Laterality, Correct Procedure, Correct Position, site marked, Risks and benefits discussed,  Surgical consent,  Pre-op evaluation,  At surgeon's request and post-op pain management  Laterality: Right  Prep: chloraprep       Needles:  Injection technique: Single-shot  Needle Type: Echogenic Needle     Needle Length: 10cm  Needle Gauge: 21     Additional Needles:   Narrative:  Start time: 07/03/2019 9:21 AM End time: 07/03/2019 9:25 AM Injection made incrementally with aspirations every 5 mL.  Performed by: Personally  Anesthesiologist: Audry Pili, MD  Additional Notes: No pain on injection. No increased resistance to injection. Injection made in 5cc increments. Good needle visualization. Patient tolerated the procedure well.

## 2019-07-03 NOTE — Anesthesia Preprocedure Evaluation (Addendum)
Anesthesia Evaluation  Patient identified by MRN, date of birth, ID band Patient awake    Reviewed: Allergy & Precautions, NPO status , Patient's Chart, lab work & pertinent test results  History of Anesthesia Complications Negative for: history of anesthetic complications  Airway Mallampati: II  TM Distance: >3 FB Neck ROM: Full    Dental  (+) Dental Advisory Given, Teeth Intact   Pulmonary former smoker,    breath sounds clear to auscultation       Cardiovascular negative cardio ROS   Rhythm:Regular Rate:Normal   '19 TTE - Normal EF, mildly dilated LA, otherwise wnl    Neuro/Psych PSYCHIATRIC DISORDERS Anxiety Depression negative neurological ROS     GI/Hepatic Neg liver ROS, GERD  Controlled, Crohn's disease    Endo/Other  Hypothyroidism  Graves disease   Renal/GU negative Renal ROS     Musculoskeletal  (+) Arthritis ,   Abdominal   Peds  Hematology negative hematology ROS (+)   Anesthesia Other Findings   Reproductive/Obstetrics  S/p hysterectomy and tubal ligation Breast cancer                             Anesthesia Physical Anesthesia Plan  ASA: II  Anesthesia Plan: General   Post-op Pain Management:  Regional for Post-op pain   Induction: Intravenous  PONV Risk Score and Plan: 3 and Treatment may vary due to age or medical condition, Ondansetron, Dexamethasone and Midazolam  Airway Management Planned: LMA  Additional Equipment: None  Intra-op Plan:   Post-operative Plan: Extubation in OR  Informed Consent: I have reviewed the patients History and Physical, chart, labs and discussed the procedure including the risks, benefits and alternatives for the proposed anesthesia with the patient or authorized representative who has indicated his/her understanding and acceptance.     Dental advisory given  Plan Discussed with: CRNA and  Anesthesiologist  Anesthesia Plan Comments:        Anesthesia Quick Evaluation

## 2019-07-03 NOTE — Op Note (Signed)
Patient Name:           [name]   Date of Surgery:        07/03/2019  Pre op Diagnosis:      Invasive ductal carcinoma right breast, upper outer quadrant, receptor positive  Post op Diagnosis:    Same  Procedure:                 Inject blue dye right breast                                      Right breast lumpectomy with radioactive seed localization                                      Right axillary deep sentinel lymph node biopsy  Surgeon:                     Edsel Petrin. Dalbert Batman, M.D., FACS  Assistant:           OR staff             Operative Indications:   This is a very pleasant 58 year old female, referred by Dr. Dr. Shelly Bombard at the Breast Ctr., [facility] for management of invasive cancer right breast upper outer quadrant. She was seen by Dr. Jana Hakim, Dr.Kinard and me. She is followed by Dr. Kelton Pillar at [facility] endocrinology and by Dr. Johnsie Cancel from cardiology.      She has no prior history of breast problems. Recent screening mammogram showing a 1.5 cm mass in the upper outer right breast with associated calcifications. 10 o'clock position. Posterior third, 6 cm from the nipple. Ultrasound of the right axilla is negative. 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 ER 100%. PR 100%. Ki-67 20%. HER-2 negative by fish.      Past history significant for Crohn's disease with 2 intestinal resections at [facility], one earlier this year. On azulfadine and questran, but no steroids. She's also had an appendectomy. History of Graves' disease, status post RIA 2002. On Synthroid. Multinodular goiter. Lower extremity edema evaluated by cardiology but no cardiac disease. Umbilical hernia repair. Family history is negative for breast or ovarian cancer.      We had a long discussion. I discussed the stage of her cancer. We talked about lumpectomy, sentinel node and radiation therapy. We compared that to mastectomy with or without reconstruction. She is strongly in  favor of breast conservation and I think she is a good candidate for that. She will be scheduled for right breast lumpectomy with radioactive seed localization, inject blue dye right breast, right axillary deep sentinel lymph node biopsy.. She agrees with this plan.  Operative Findings:       The tumor was high in the upper outer quadrant of the right breast.  The broad posterior margin is the pectoralis muscle and the axilla.  The tumor was so high that I was able to do the sentinel node mapping and biopsy through the same incision.  The specimen mammogram looked good, showing the radioactive seed and the marker clip close to one another but in the center of the specimen.  Procedure in Detail:          Following the induction of general LMA anesthesia a surgical timeout was performed.  Intravenous antibiotics were  given.  Following alcohol prep I injected 5 cc of dilute methylene blue into the right breast, subareolar area, and massaged the breast for a few minutes.  The entire right chest wall and axilla were then prepped and draped in a sterile fashion.  0.5% Marcaine with epinephrine was used as a local infiltration anesthetic.     Using the neoprobe I isolated the radioactive signal in the upper outer quadrant of the right breast.  A curvilinear incision was made high in the upper outer quadrant with a knife.  The lumpectomy was performed using the neoprobe and electrocautery.  The specimen was removed and marked with silk sutures and a 6 color ink kit.  The specimen mammogram looked good as described above.  The specimen was sent to the lab where the seed was retrieved.      The wound was inspected.  The neoprobe was shifted to the technetium setting.  I found that I was actually close to the axilla and could access the axillary nodes through the same incision.  Sentinel nodes were mapped out and excised using the neoprobe.  There was actually a small bundle of sentinel nodes which were removed as a  single specimen.  After these were removed there was no more blue dye and no more radioactivity.  The wound was irrigated with saline.  Hemostasis was excellent and achieved with electrocautery and 1 metal clip.  I placed 5 metal marker clips in the walls of the lumpectomy cavity.  The deeper breast tissues were closed with interrupted sutures of 3-0 Vicryl.  The superficial breast tissues were closed with 3-0 Vicryl.  The skin was closed with a running subcuticular 4-0 Monocryl and Dermabond.  Dry bandages and a breast binder were placed.  The patient tolerated the procedure well was taken to PACU in stable condition.  EBL 25 cc.  Counts correct.  Complications none.    Addendum: I logged onto the PMP aware website and reviewed her prescription medication history     Tru Leopard M. Dalbert Batman, M.D., FACS General and Minimally Invasive Surgery Breast and Colorectal Surgery  07/03/2019 11:44 AM

## 2019-07-03 NOTE — Transfer of Care (Signed)
Immediate Anesthesia Transfer of Care Note  Patient: Carle Dargan  Procedure(s) Performed: RIGHT BREAST LUMPECTOMY WITH RADIOACTIVE SEED AND RIGHT AXILLARY SENTINEL LYMPH NODE BIOPSY DEEP INJECT BLUE DYE RIGHT BREAST (Right Breast)  Patient Location: PACU  Anesthesia Type:General  Level of Consciousness: drowsy  Airway & Oxygen Therapy: Patient Spontanous Breathing and Patient connected to face mask oxygen  Post-op Assessment: Report given to RN and Post -op Vital signs reviewed and stable  Post vital signs: Reviewed and stable  Last Vitals:  Vitals Value Taken Time  BP 128/74 07/03/19 1150  Temp    Pulse 67 07/03/19 1151  Resp 10 07/03/19 1151  SpO2 100 % 07/03/19 1151  Vitals shown include unvalidated device data.  Last Pain: There were no vitals filed for this visit.       Complications: No apparent anesthesia complications

## 2019-07-03 NOTE — Discharge Instructions (Signed)
[place] Office Phone Number [phone]  BREAST BIOPSY/ PARTIAL MASTECTOMY: POST OP INSTRUCTIONS  Always review your discharge instruction sheet given to you by the facility where your surgery was performed.  IF YOU HAVE DISABILITY OR FAMILY LEAVE FORMS, YOU MUST BRING THEM TO THE OFFICE FOR PROCESSING.  DO NOT GIVE THEM TO YOUR DOCTOR.  1. A prescription for pain medication may be given to you upon discharge.  Take your pain medication as prescribed, if needed.  If narcotic pain medicine is not needed, then you may take acetaminophen (Tylenol) or ibuprofen (Advil) as needed. 2. Take your usually prescribed medications unless otherwise directed 3. If you need a refill on your pain medication, please contact your pharmacy.  They will contact our office to request authorization.  Prescriptions will not be filled after 5pm or on week-ends. 4. You should eat very light the first 24 hours after surgery, such as soup, crackers, pudding, etc.  Resume your normal diet the day after surgery. 5. Most patients will experience some swelling and bruising in the breast.  Ice packs and a good support bra will help.  Swelling and bruising can take several days to resolve.  6. It is common to experience some constipation if taking pain medication after surgery.  Increasing fluid intake and taking a stool softener will usually help or prevent this problem from occurring.  A mild laxative (Milk of Magnesia or Miralax) should be taken according to package directions if there are no bowel movements after 48 hours. 7. Unless discharge instructions indicate otherwise, you may remove your bandages 24-48 hours after surgery, and you may shower at that time.  You may have steri-strips (small skin tapes) in place directly over the incision.  These strips should be left on the skin for 7-10 days.  If your surgeon used skin glue on the incision, you may shower in 24 hours.  The glue will flake off over the  next 2-3 weeks.  Any sutures or staples will be removed at the office during your follow-up visit. 8. ACTIVITIES:  You may resume regular daily activities (gradually increasing) beginning the next day.  Wearing a good support bra or sports bra minimizes pain and swelling.  You may have sexual intercourse when it is comfortable. a. You may drive when you no longer are taking prescription pain medication, you can comfortably wear a seatbelt, and you can safely maneuver your car and apply brakes. b. RETURN TO WORK:  ______________________________________________________________________________________ 9. You should see your doctor in the office for a follow-up appointment approximately two weeks after your surgery.  Your doctors nurse will typically make your follow-up appointment when she calls you with your pathology report.  Expect your pathology report 2-3 business days after your surgery.  You may call to check if you do not hear from [place] after three days. 10. OTHER INSTRUCTIONS: _______________________________________________________________________________________________ _____________________________________________________________________________________________________________________________________ _____________________________________________________________________________________________________________________________________ _____________________________________________________________________________________________________________________________________  WHEN TO CALL YOUR DOCTOR: 1. Fever over 101.0 2. Nausea and/or vomiting. 3. Extreme swelling or bruising. 4. Continued bleeding from incision. 5. Increased pain, redness, or drainage from the incision.  The clinic staff is available to answer your questions during regular business hours.  Please dont hesitate to call and ask to speak to one of the nurses for clinical concerns.  If you have a medical emergency, go to the nearest  emergency room or call 911.  A surgeon from [facility] is always on call at the hospital.  For further questions, please visit [email or web address]  Managing Your Pain After Surgery Without Opioids    Thank you for participating in our program to help patients manage their pain after surgery without opioids. This is part of our effort to provide you with the best care possible, without exposing you or your family to the risk that opioids pose.  What pain can I expect after surgery? You can expect to have some pain after surgery. This is normal. The pain is typically worse the day after surgery, and quickly begins to get better. Many studies have found that many patients are able to manage their pain after surgery with Over-the-Counter (OTC) medications such as Tylenol and Motrin. If you have a condition that does not allow you to take Tylenol or Motrin, notify your surgical team.  How will I manage my pain? The best strategy for controlling your pain after surgery is around the clock pain control with Tylenol (acetaminophen) and Motrin (ibuprofen or Advil). Alternating these medications with each other allows you to maximize your pain control. In addition to Tylenol and Motrin, you can use heating pads or ice packs on your incisions to help reduce your pain.  How will I alternate your regular strength over-the-counter pain medication? You will take a dose of pain medication every three hours. ; Start by taking 650 mg of Tylenol (2 pills of 325 mg) ; 3 hours later take 600 mg of Motrin (3 pills of 200 mg) ; 3 hours after taking the Motrin take 650 mg of Tylenol ; 3 hours after that take 600 mg of Motrin.   - 1 -  See example - if your first dose of Tylenol is at 12:00 PM   12:00 PM Tylenol 650 mg (2 pills of 325 mg)  3:00 PM Motrin 600 mg (3 pills of 200 mg)  6:00 PM Tylenol 650 mg (2 pills of 325 mg)  9:00 PM Motrin 600 mg (3  pills of 200 mg)  Continue alternating every 3 hours   We recommend that you follow this schedule around-the-clock for at least 3 days after surgery, or until you feel that it is no longer needed. Use the table on the last page of this handout to keep track of the medications you are taking. Important: Do not take more than 3045m of Tylenol or 32033mof Motrin in a 24-hour period. Do not take ibuprofen/Motrin if you have a history of bleeding stomach ulcers, severe kidney disease, &/or actively taking a blood thinner  What if I still have pain? If you have pain that is not controlled with the over-the-counter pain medications (Tylenol and Motrin or Advil) you might have what we call breakthrough pain. You will receive a prescription for a small amount of an opioid pain medication such as Oxycodone, Tramadol, or Tylenol with Codeine. Use these opioid pills in the first 24 hours after surgery if you have breakthrough pain. Do not take more than 1 pill every 4-6 hours.  If you still have uncontrolled pain after using all opioid pills, don't hesitate to call our staff using the number provided. We will help make sure you are managing your pain in the best way possible, and if necessary, we can provide a prescription for additional pain medication.   Day 1    Time  Name of Medication Number of pills taken  Amount of Acetaminophen  Pain Level   Comments  AM PM       AM PM       AM PM  AM PM       AM PM       AM PM       AM PM       AM PM       Total Daily amount of Acetaminophen Do not take more than  3,000 mg per day      Day 2    Time  Name of Medication Number of pills taken  Amount of Acetaminophen  Pain Level   Comments  AM PM       AM PM       AM PM       AM PM       AM PM       AM PM       AM PM       AM PM       Total Daily amount of Acetaminophen Do not take more than  3,000 mg per day      Day 3    Time  Name of Medication Number of pills taken    Amount of Acetaminophen  Pain Level   Comments  AM PM       AM PM       AM PM       AM PM          AM PM       AM PM       AM PM       AM PM       Total Daily amount of Acetaminophen Do not take more than  3,000 mg per day      Day 4    Time  Name of Medication Number of pills taken  Amount of Acetaminophen  Pain Level   Comments  AM PM       AM PM       AM PM       AM PM       AM PM       AM PM       AM PM       AM PM       Total Daily amount of Acetaminophen Do not take more than  3,000 mg per day      Day 5    Time  Name of Medication Number of pills taken  Amount of Acetaminophen  Pain Level   Comments  AM PM       AM PM       AM PM       AM PM       AM PM       AM PM       AM PM       AM PM       Total Daily amount of Acetaminophen Do not take more than  3,000 mg per day       Day 6    Time  Name of Medication Number of pills taken  Amount of Acetaminophen  Pain Level  Comments  AM PM       AM PM       AM PM       AM PM       AM PM       AM PM       AM PM       AM PM       Total Daily amount of Acetaminophen Do not take more than  3,000 mg per day      Day 7    Time  Name of Medication Number of pills taken  Amount of Acetaminophen  Pain Level   Comments  AM PM       AM PM       AM PM       AM PM       AM PM       AM PM       AM PM       AM PM       Total Daily amount of Acetaminophen Do not take more than  3,000 mg per day        For additional information about how and where to safely dispose of unused opioid medications - [employer]  Disclaimer: This document contains information and/or instructional materials adapted from [place] for the typical patient with your condition. It does not replace medical advice from your health care provider because your experience may differ from that of the typical patient. Talk to your health care provider if you have any questions about  this document, your condition or your treatment plan. Adapted from [place]

## 2019-07-03 NOTE — Anesthesia Postprocedure Evaluation (Signed)
Anesthesia Post Note  Patient: [name]  Procedure(s) Performed: RIGHT BREAST LUMPECTOMY WITH RADIOACTIVE SEED AND RIGHT AXILLARY SENTINEL LYMPH NODE BIOPSY DEEP INJECT BLUE DYE RIGHT BREAST (Right Breast)     Patient location during evaluation: PACU Anesthesia Type: General Level of consciousness: awake and alert Pain management: pain level controlled Vital Signs Assessment: post-procedure vital signs reviewed and stable Respiratory status: spontaneous breathing, nonlabored ventilation and respiratory function stable Cardiovascular status: blood pressure returned to baseline and stable Postop Assessment: no apparent nausea or vomiting Anesthetic complications: no    Last Vitals:  Vitals:   07/03/19 1305 07/03/19 1335  BP: 127/75 123/76  Pulse: 73 79  Resp: 20 13  Temp:    SpO2: 98%                   [name]

## 2019-07-03 NOTE — Anesthesia Procedure Notes (Signed)
Procedure Name: LMA Insertion Date/Time: 07/03/2019 10:41 AM Performed by: Bryson Corona, CRNA Pre-anesthesia Checklist: Patient identified, Emergency Drugs available, Suction available and Patient being monitored Patient Re-evaluated:Patient Re-evaluated prior to induction Oxygen Delivery Method: Circle System Utilized Preoxygenation: Pre-oxygenation with 100% oxygen Induction Type: IV induction LMA: LMA inserted LMA Size: 4.0 Number of attempts: 1 Placement Confirmation: positive ETCO2 Tube secured with: Tape Dental Injury: Teeth and Oropharynx as per pre-operative assessment

## 2019-07-03 NOTE — Interval H&P Note (Signed)
History and Physical Interval Note:  07/03/2019 9:46 AM  [name]  has presented today for surgery, with the diagnosis of INVASIVE CANCER RIGHT BREAST, UPPER OUTER QUADRANT.  The various methods of treatment have been discussed with the patient and family. After consideration of risks, benefits and other options for treatment, the patient has consented to  Procedure(s): RIGHT BREAST LUMPECTOMY WITH RADIOACTIVE SEED AND RIGHT AXILLARY SENTINEL LYMPH NODE BIOPSY DEEP INJECT BLUE DYE RIGHT BREAST (Right) as a surgical intervention.  The patient's history has been reviewed, patient examined, no change in status, stable for surgery.  I have reviewed the patient's chart and labs.  Questions were answered to the patient's satisfaction.     Adin Hector

## 2019-07-04 ENCOUNTER — Encounter (HOSPITAL_COMMUNITY): Payer: Self-pay | Admitting: General Surgery

## 2019-07-08 ENCOUNTER — Telehealth: Payer: Self-pay | Admitting: *Deleted

## 2019-07-08 NOTE — Telephone Encounter (Signed)
Ordered oncotype per Dr. Jana Hakim.  Faxed requisition to pathology and confirmed receipt.

## 2019-07-16 ENCOUNTER — Other Ambulatory Visit: Payer: Self-pay

## 2019-07-16 ENCOUNTER — Encounter: Payer: Self-pay | Admitting: Physical Therapy

## 2019-07-16 ENCOUNTER — Ambulatory Visit: Payer: 59 | Admitting: Oncology

## 2019-07-16 ENCOUNTER — Ambulatory Visit: Payer: 59 | Attending: General Surgery | Admitting: Physical Therapy

## 2019-07-16 DIAGNOSIS — M25611 Stiffness of right shoulder, not elsewhere classified: Secondary | ICD-10-CM | POA: Insufficient documentation

## 2019-07-16 DIAGNOSIS — Z170 Estrogen receptor positive status [ER+]: Secondary | ICD-10-CM | POA: Diagnosis present

## 2019-07-16 DIAGNOSIS — C50411 Malignant neoplasm of upper-outer quadrant of right female breast: Secondary | ICD-10-CM | POA: Insufficient documentation

## 2019-07-16 DIAGNOSIS — R293 Abnormal posture: Secondary | ICD-10-CM | POA: Diagnosis present

## 2019-07-16 DIAGNOSIS — Z483 Aftercare following surgery for neoplasm: Secondary | ICD-10-CM | POA: Diagnosis present

## 2019-07-16 NOTE — Therapy (Signed)
[place], [place], [address] Phone: [phone]   Fax:  [phone]  Physical Therapy Treatment  Patient Details  Name: [name] MRN: [id number] Date of Birth: Oct 26, 1961 Referring Provider (PT): Dr. Fanny Skates   Encounter Date: 07/16/2019  PT End of Session - 07/16/19 1144    Visit Number  2    Number of Visits  2    PT Start Time  2353    PT Stop Time  1150    PT Time Calculation (min)  45 min    Activity Tolerance  Patient tolerated treatment well    Behavior During Therapy  [facility] for tasks assessed/performed       Past Medical History:  Diagnosis Date  . Allergy   . Anal fissure   . Anxiety   . Arthritis    -knees, hips- is bothersome now  . Breast cancer ([place]) 06/03/2019  . Colon polyps   . Crohn disease ([place])    10-27-15   . Delayed gastric emptying   . Depression   . Family history of adverse reaction to anesthesia    daughter has post op N&V  . GERD (gastroesophageal reflux disease)    occasional  . Graves disease    started with this  . H/O steroid therapy    presently on tapering doses of Prednisone, Entercort.  . Hypothyroidism    current  . IBS (irritable bowel syndrome)   . Osteopenia   . Vitamin B12 deficiency    remains under tx.  injections every 4 weeks-today a little more that 4 weeks.    Past Surgical History:  Procedure Laterality Date  . ABDOMINAL HYSTERECTOMY  2004   complete  . APPENDECTOMY  2004  . BOWEL RESECTION  2004   Crohns  . BREAST LUMPECTOMY WITH RADIOACTIVE SEED AND SENTINEL LYMPH NODE BIOPSY Right 07/03/2019   Procedure: RIGHT BREAST LUMPECTOMY WITH RADIOACTIVE SEED AND RIGHT AXILLARY SENTINEL LYMPH NODE BIOPSY DEEP INJECT BLUE DYE RIGHT BREAST;  Surgeon: Fanny Skates, MD;  Location: [facility];  Service: General;  Laterality: Right;  . COLONOSCOPY    . COLONOSCOPY W/ POLYPECTOMY    . COLONOSCOPY WITH PROPOFOL N/A 11/01/2015   Procedure: COLONOSCOPY WITH PROPOFOL;  Surgeon: Garlan Fair, MD;  Location: [facility];  Service: Endoscopy;  Laterality: N/A;  . POLYPECTOMY    . radioiodine therapy  2003  . SMALL INTESTINE SURGERY  01/2019  . TEMPOROMANDIBULAR JOINT SURGERY Left 1990  . TUBAL LIGATION  1989  . UMBILICAL HERNIA REPAIR N/A 08/06/2013   Procedure: HERNIA REPAIR UMBILICAL ADULT;  Surgeon: Odis Hollingshead, MD;  Location: [facility];  Service: General;  Laterality: N/A;  WITH MESH  . UPPER GASTROINTESTINAL ENDOSCOPY      There were no vitals filed for this visit.  Subjective Assessment - 07/16/19 1118    Subjective  Patient reports she underwent a right lumpectomy and sentinel node biopsy (0/1 nodes positive) on 07/03/2019. She is awaiting Oncotype results to see if she needs chemo. She will undergo radiation and anti-estrogen therapy.    Pertinent History  Patient was diagnosed on 05/20/2019 with right grade II invasive ductal carcinoma breast cancer. It measures 1.5 cm and is located in the upper outer quadrant. It is ER/PR positive and HER2 negative with a Ki67 of 20%. Patient reports she underwent a right lumpectomy and sentinel node biopsy (0/1 nodes positive) on 07/03/2019 She has a history of a bowel resection in February  2020 due to Crohn's disease.    Patient Stated Goals  Make sure my arm is doing ok.    Currently in Pain?  Yes    Pain Score  1     Pain Location  Axilla    Pain Orientation  Right    Pain Descriptors / Indicators  Sore;Aching    Pain Type  Surgical pain    Pain Onset  1 to 4 weeks ago    Pain Frequency  Intermittent    Aggravating Factors   Nothing    Pain Relieving Factors  Nothing         [facility] PT Assessment - 07/16/19 0001      Assessment   Medical Diagnosis  s/p right lumpectomy and SLNB    Referring Provider (PT)  Dr. Fanny Skates    Onset Date/Surgical Date  07/03/19    Hand Dominance  Right    Prior Therapy  Baselines      Precautions   Precautions  Other  (comment)    Precaution Comments  Active cancer; Crohn's disease; right arm lymphedema risk      Restrictions   Weight Bearing Restrictions  No      Balance Screen   Has the patient fallen in the past 6 months  No    Has the patient had a decrease in activity level because of a fear of falling?   No    Is the patient reluctant to leave their home because of a fear of falling?   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 No edema present but is slightly tender to palpation in axilla. No cording present.      Posture/Postural Control   Posture/Postural Control  Postural limitations    Postural Limitations  Rounded Shoulders;Forward head      ROM / Strength   AROM / PROM / Strength  AROM      AROM   AROM Assessment Site  Shoulder    Right/Left Shoulder  Right    Right Shoulder Extension  53 Degrees    Right Shoulder Flexion  141 Degrees    Right Shoulder ABduction  139 Degrees    Right Shoulder Internal Rotation  63 Degrees    Right Shoulder External Rotation  91 Degrees        LYMPHEDEMA/ONCOLOGY QUESTIONNAIRE - 07/16/19 1126      Type   Cancer Type  Right breast      Surgeries   Lumpectomy Date  07/03/19    Sentinel Lymph Node Biopsy Date  07/03/19    Number Lymph Nodes Removed  1      Treatment   Active Chemotherapy Treatment  No    Past Chemotherapy Treatment  No    Active Radiation Treatment  No    Past Radiation  Treatment  No  Current Hormone Treatment  No    Past Hormone Therapy  No      What other symptoms do you have   Are you Having Heaviness or Tightness  Yes    Are you having Pain  Yes    Are you having pitting edema  No    Is it Hard or Difficult finding clothes that fit  No    Do you have infections  No    Is there Decreased scar mobility  Yes    [place]mer Sign  No      Right Upper Extremity Lymphedema   10 cm Proximal to Olecranon Process  30.5 cm    Olecranon Process  23.9 cm    10 cm Proximal to Ulnar Styloid Process  22 cm    Just Proximal to Ulnar Styloid Process  14.8 cm    Across Hand at [employer]  17.5 cm    At [place] of 2nd Digit  5.8 cm      Left Upper Extremity Lymphedema   10 cm Proximal to Olecranon Process  30.5 cm    Olecranon Process  23.7 cm    10 cm Proximal to Ulnar Styloid Process  22.8 cm    Just Proximal to Ulnar Styloid Process  15.7 cm    Across Hand at [employer]  17.4 cm    At [place] of 2nd Digit  5.5 cm        Quick Dash - 07/16/19 0001    Open a tight or new jar  No difficulty    Do heavy household chores (wash walls, wash floors)  No difficulty    Carry a shopping bag or briefcase  No difficulty    Wash your back  No difficulty    Use a knife to cut food  No difficulty    Recreational activities in which you take some force or impact through your arm, shoulder, or hand (golf, hammering, tennis)  Mild difficulty    During the past week, to what extent has your arm, shoulder or hand problem interfered with your normal social activities with family, friends, neighbors, or groups?  Slightly    During the past week, to what extent has your arm, shoulder or hand problem limited your work or other regular daily activities  Not at all    Arm, shoulder, or hand pain.  None    Tingling (pins and needles) in your arm, shoulder, or hand  None    Difficulty Sleeping  No difficulty    DASH Score  4.55 %                     PT  Education - 07/16/19 1143    Education Details  Lymphedema risk reduction, infection prevention; reviewed HEP with focus on shoulder abduction. Educated her on scar tissue massage for right axilla using coconut oil.    Person(s) Educated  Patient    Methods  Explanation;Demonstration;Handout    Comprehension  Verbalized understanding;Returned demonstration          PT Long Term Goals - 07/16/19 1152      PT LONG TERM GOAL #1   Title  Patient will demonstrate she has regained full shoulder ROM and function post operatively compared to baselines.    Time  8    Period  Weeks    Status  Achieved            Plan - 07/16/19 1144  Clinical Impression Statement  Patient is doing very well s/p right lumpectomy and SLNB. Her ROM is nearly full compared to baseline. She is moving to [place] next week but really has no further need for PT. She knows to contact me if anything changes with her arm or pain.    Rehab Potential  Excellent    PT Treatment/Interventions  Therapeutic exercise;Manual techniques;Patient/family education;ADLs/Self Care Home Management    PT Next Visit Plan  D/C    PT Home Exercise Plan  Post op shoulder ROM HEP    Consulted and Agree with Plan of Care  Patient       Patient will benefit from skilled therapeutic intervention in order to improve the following deficits and impairments:  Pain, Impaired UE functional use, Decreased knowledge of precautions, Postural dysfunction, Decreased range of motion, Decreased scar mobility  Visit Diagnosis: 1. Malignant neoplasm of upper-outer quadrant of right breast in female, estrogen receptor positive ([place])   2. Abnormal posture   3. Aftercare following surgery for neoplasm   4. Stiffness of right shoulder, not elsewhere classified        Problem List Patient Active Problem List   Diagnosis Date Noted  . Malignant neoplasm of upper-outer quadrant of right breast in female, estrogen receptor positive ([place])  06/05/2019  . Multiple thyroid nodules 11/02/2018  . 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 Crohn's disease of small intestine without complication ([place]) 56/31/4970   PHYSICAL THERAPY DISCHARGE SUMMARY  Visits from Start of Care: 2  Current functional level related to goals / functional outcomes: Goals met. She is doing well. See objective measurements above.   Remaining deficits: Abduction ROM slightly limited   Education / Equipment: HEP and lymphedema risk reduction education  Plan: Patient agrees to discharge.  Patient goals were met. Patient is being discharged due to meeting the stated rehab goals.  ?????         Annia Friendly, [place] 07/16/19 11:56 AM   [place], [place], [address] Phone: [phone]   Fax:  [phone]  Name: [name] MRN: [id number] Date of Birth: 1961/05/03

## 2019-07-17 ENCOUNTER — Encounter (HOSPITAL_COMMUNITY): Payer: Self-pay | Admitting: Oncology

## 2019-07-20 ENCOUNTER — Ambulatory Visit: Payer: 59 | Admitting: Oncology

## 2019-07-23 ENCOUNTER — Other Ambulatory Visit: Payer: Self-pay | Admitting: *Deleted

## 2019-07-23 ENCOUNTER — Telehealth: Payer: Self-pay | Admitting: *Deleted

## 2019-07-23 DIAGNOSIS — C50411 Malignant neoplasm of upper-outer quadrant of right female breast: Secondary | ICD-10-CM

## 2019-07-23 NOTE — Telephone Encounter (Signed)
Received oncotype results of 21/7%.  Patient is aware.  She will cancel her appointment with Dr. Jana Hakim 8/19 and see after xrt complete.

## 2019-07-28 NOTE — Progress Notes (Signed)
Location of Breast Cancer: Malignant neoplasm of upper-outer quadrant of right breast in female, estrogen receptor positive ([place]).  Histology per Pathology Report: 07/03/19:  Diagnosis 1. Breast, lumpectomy, Right - INVASIVE DUCTAL CARCINOMA WITH CALCIFICATIONS, GRADE I/III, SPANNING 1.5 CM. - THE SURGICAL RESECTION MARGINS ARE NEGATIVE FOR CARCINOMA. - SEE ONCOLOGY TABLE BELOW. 2. Lymph node, sentinel, biopsy, Right - THERE IS NO EVIDENCE OF CARCINOMA IN 1 OF 1 LYMPH NODE (0/1).  Receptor Status: ER(100%), PR (100%), Her2-neu (no amplification), Ki-(20%)  Did patient present with symptoms (if so, please note symptoms) or was this found on screening mammography?:She had routine screening mammography showing a possible abnormality in the right breast. She underwent right diagnostic mammography with tomography and right breast ultrasonography at [facility] on 06/02/2019 showing: 1.5 cm mass in the upper outer aspect of the right breast; no abnormal or enlarged lymph nodes in the right axilla.  Biopsy on 06/03/2019 showed: invasive ductal carcinoma with extravasated mucin, grade 2. Prognostic indicators significant for: estrogen receptor, 100% positive and progesterone receptor, 100% positive, both with strong staining intensity. Proliferation marker Ki67 at 20%. HER2 equivocal. Negative by Fish   Past/Anticipated interventions by surgeon, if any: 07/03/19: Procedure:                 Inject blue dye right breast                                      Right breast lumpectomy with radioactive seed localization                                      Right axillary deep sentinel lymph node biopsy  Surgeon:                     Edsel Petrin. Dalbert Batman, M.D., [facility]  Past/Anticipated interventions by medical oncology, if any: Chemotherapy  07/23/19:  Received oncotype results of 21/7%.  Patient is aware.  She will cancel her appointment with Dr. Jana Hakim 8/19 and see after xrt complete  Lymphedema issues, if  any:  none  Pain issues, if any:  none  SAFETY ISSUES:  Prior radiation? Iodnine 151  Pacemaker/ICD? no  Possible current pregnancy?no  Is the patient on methotrexate? no  Current Complaints / other details:  Patient in for consult for right breast cancer she has  a history of chron's disease and has surgery to small bowel 01/24/19. She is her for consult but will not be treated here she needs to be sent closer to her home. [place] is the closest center since her move.  Brandt Loosen RN    Loma Sousa, RN 07/28/2019,11:01 AM

## 2019-07-29 ENCOUNTER — Ambulatory Visit
Admission: RE | Admit: 2019-07-29 | Discharge: 2019-07-29 | Disposition: A | Discharge status: Home / Self Care | Payer: 59 | Source: Ambulatory Visit | Attending: Radiation Oncology | Admitting: Radiation Oncology

## 2019-07-29 ENCOUNTER — Ambulatory Visit: Payer: 59 | Admitting: Oncology

## 2019-07-29 ENCOUNTER — Encounter: Payer: Self-pay | Admitting: Radiation Oncology

## 2019-07-29 ENCOUNTER — Other Ambulatory Visit: Payer: Self-pay

## 2019-07-29 VITALS — BP 113/64 | HR 68 | Temp 98.5°F | Resp 18 | Wt 152.0 lb

## 2019-07-29 DIAGNOSIS — C50411 Malignant neoplasm of upper-outer quadrant of right female breast: Secondary | ICD-10-CM

## 2019-07-29 DIAGNOSIS — Z170 Estrogen receptor positive status [ER+]: Secondary | ICD-10-CM

## 2019-07-29 NOTE — Patient Instructions (Signed)
Coronavirus (COVID-19) Are you at risk?  Are you at risk for the Coronavirus (COVID-19)?  To be considered HIGH RISK for Coronavirus (COVID-19), you have to meet the following criteria:  . Traveled to [place], [place], [place], [place] or [place]; or in the [place] to [place], [place], [place], or [place]; and have fever, cough, and shortness of breath within the last 2 weeks of travel OR . Been in close contact with a person diagnosed with COVID-19 within the last 2 weeks and have fever, cough, and shortness of breath . IF YOU DO NOT MEET THESE CRITERIA, YOU ARE CONSIDERED LOW RISK FOR COVID-19.  What to do if you are HIGH RISK for COVID-19?  Marland Kitchen If you are having a medical emergency, call 911. . Seek medical care right away. Before you go to a doctor's office, urgent care or emergency department, call ahead and tell them about your recent travel, contact with someone diagnosed with COVID-19, and your symptoms. You should receive instructions from your physician's office regarding next steps of care.  . When you arrive at healthcare provider, tell the healthcare staff immediately you have returned from visiting [place], [place], [place], [place] or [place]; or traveled in the [place] to [place], [place], [place], or [place]; in the last two weeks or you have been in close contact with a person diagnosed with COVID-19 in the last 2 weeks.   . Tell the health care staff about your symptoms: fever, cough and shortness of breath. . After you have been seen by a medical provider, you will be either: o Tested for (COVID-19) and discharged home on quarantine except to seek medical care if symptoms worsen, and asked to  - Stay home and avoid contact with others until you get your results (4-5 days)  - Avoid travel on public transportation if possible (such as bus, train, or airplane) or o Sent to the Emergency Department by EMS for evaluation, COVID-19 testing, and possible  admission depending on your condition and test results.  What to do if you are LOW RISK for COVID-19?  Reduce your risk of any infection by using the same precautions used for avoiding the common cold or flu:  Marland Kitchen Wash your hands often with soap and warm water for at least 20 seconds.  If soap and water are not readily available, use an alcohol-based hand sanitizer with at least 60% alcohol.  . If coughing or sneezing, cover your mouth and nose by coughing or sneezing into the elbow areas of your shirt or coat, into a tissue or into your sleeve (not your hands). . Avoid shaking hands with others and consider head nods or verbal greetings only. . Avoid touching your eyes, nose, or mouth with unwashed hands.  . Avoid close contact with people who are sick. . Avoid places or events with large numbers of people in one location, like concerts or sporting events. . Carefully consider travel plans you have or are making. . If you are planning any travel outside or inside the [place], visit the CDC's Travelers' Health webpage for the latest health notices. . If you have some symptoms but not all symptoms, continue to monitor at home and seek medical attention if your symptoms worsen. . If you are having a medical emergency, call 911.   [place] / e-Visit: [email or web address]         [facility]: [place]  Urgent Care: [facility]: [phone]

## 2019-07-29 NOTE — Progress Notes (Signed)
Radiation Oncology         ([phone]) [phone] ________________________________  Name: [name] MRN: [id number]  Date: 07/29/2019  DOB: 31-Jul-1961  Re-Evaluation Note  CC: Lanice Shirts, MD  Magrinat, Virgie Dad, MD    ICD-10-CM   1. Malignant neoplasm of upper-outer quadrant of right breast in female, estrogen receptor positive ([place])  C50.411    Z17.0     Diagnosis:   Stage I (pT1c, pN0) Right Breast UOQ, Invasive Ductal Carcinoma, ER+ / PR+ / Her2-, Grade 2  Narrative:  The patient returns today to discuss radiation treatment options. She was seen in the multidisciplinary breast clinic on 06/10/2019.   She opted to proceed with right lumpectomy with sentinel lymph node biopsy on 07/03/2019. Pathology from the procedure revealed: invasive ductal carcinoma with calcifications, grade 1, 1.5 cm; resection margins negative; biopsied lymph node was negative (0/1).    Oncotype DX was obtained on the final surgical sample and the recurrence score of 21 predicts a risk of recurrence outside the breast over the next 9 years of 7%, if the patient's only systemic therapy is an antiestrogen for 5 years.  It also predicts no benefit from chemotherapy.  Therefore adjuvant chemotherapy was not recommended.  On review of systems, the patient denies any lymphedema issues, pain, and any other symptoms.  He does have significant problems with hot flashes and night sweats since she has stopped using her estrogen patch   Allergies:  is allergic to mercaptopurine; aspirin adult low [aspirin]; and gluten meal.  Meds: Current Outpatient Medications  Medication Sig Dispense Refill   cetirizine (ZYRTEC) 10 MG tablet Take 10 mg by mouth daily.      Cholecalciferol (VITAMIN D3) 5000 units CAPS Take 5,000 Units by mouth daily.      Coenzyme Q10 (CO Q-10) 300 MG CAPS Take 300 mg by mouth at bedtime.      colestipol (COLESTID) 1 g tablet Take 2 g by mouth 2 (two) times daily.     Cyanocobalamin  (VITAMIN B-12) 2500 MCG SUBL Take 2,500 mcg by mouth daily.     Multiple Vitamin (MULTIVITAMIN WITH MINERALS) TABS Take 1 tablet by mouth daily.     NONFORMULARY OR COMPOUNDED ITEM Place 1 application rectally daily as needed (for anal fissure). Nitroglycerin 0.125% Ointment     Omega-3 Fatty Acids (FISH OIL) 1000 MG CAPS Take 1,000 mg by mouth daily.      sulfaSALAzine (AZULFIDINE) 500 MG tablet Take 1,000 mg by mouth 2 (two) times daily.      SYNTHROID 88 MCG tablet Take 88 mcg by mouth daily before breakfast.   2   zolpidem (AMBIEN) 5 MG tablet Take 1 tablet (5 mg total) by mouth at bedtime as needed for sleep. (Patient taking differently: Take 5 mg by mouth at bedtime. ) 60 tablet 1   B Complex-Biotin-FA (B COMPLETE PO) Take 1 tablet by mouth daily.     HYDROcodone-acetaminophen (NORCO) 5-325 MG tablet Take 1-2 tablets by mouth every 6 (six) hours as needed for moderate pain or severe pain. (Patient not taking: Reported on 07/16/2019) 20 tablet 0   No current facility-administered medications for this encounter.     Physical Findings: The patient is in no acute distress. Patient is alert and oriented.  weight is 152 lb (68.9 kg). Her temperature is 98.5 F (36.9 C). Her blood pressure is 113/64 and her pulse is 68. Her respiration is 18 and oxygen saturation is 100%.  No significant changes. Lungs  are clear to auscultation bilaterally. Heart has regular rate and rhythm. No palpable cervical, supraclavicular, or axillary adenopathy. Abdomen soft, non-tender, normal bowel sounds. Left Breast: no palpable mass, nipple discharge or bleeding. Right Breast: Patient has a well-healing scar in the upper outer quadrant with surgical glue in place.  No signs of drainage or infection.  One scar is present and therefore sentinel node procedure was performed through the lumpectomy scar.  Lab Findings: Lab Results  Component Value Date   WBC 6.8 06/30/2019   HGB 14.6 06/30/2019   HCT 43.4  06/30/2019   MCV 93.7 06/30/2019   PLT 282 06/30/2019    Radiographic Findings: Nm Sentinel Node Inj-no Rpt (breast)  Result Date: 07/03/2019 Sulfur colloid was injected by the nuclear medicine technologist for melanoma sentinel node.   Mm Breast Surgical Specimen  Result Date: 07/03/2019 CLINICAL DATA:  Right lumpectomy for biopsy-proven invasive ductal carcinoma in the 10 o'clock position of the right breast. EXAM: SPECIMEN RADIOGRAPH OF THE RIGHT BREAST COMPARISON:  Previous exam(s). FINDINGS: Status post excision of the right breast. The radioactive seed, small mass with minimal calcification and ribbon shaped biopsy marker clip are present, completely intact, and were marked for pathology. IMPRESSION: Specimen radiograph of the right breast. Electronically Signed   By: Claudie Revering M.D.   On: 07/03/2019 11:19   Mm Rt Radioactive Seed Loc Mammo Guide  Result Date: 07/02/2019 CLINICAL DATA:  Radioactive seed localization prior to lumpectomy is requested. The patient has a known biopsy-proven invasive ductal carcinoma with extravasated mucin in the right breast 10 o'clock position. EXAM: MAMMOGRAPHIC GUIDED RADIOACTIVE SEED LOCALIZATION OF THE RIGHT BREAST COMPARISON:  Previous exam(s). FINDINGS: Patient presents for radioactive seed localization prior to lumpectomy. I met with the patient and we discussed the procedure of seed localization including benefits and alternatives. We discussed the high likelihood of a successful procedure. We discussed the risks of the procedure including infection, bleeding, tissue injury and further surgery. We discussed the low dose of radioactivity involved in the procedure. Informed, written consent was given. The usual time-out protocol was performed immediately prior to the procedure. Using mammographic guidance, sterile technique, 1% lidocaine and an I-125 radioactive seed, the mass and ribbon shaped biopsy clip were localized using a superior approach. The  follow-up mammogram images confirm the seed 2-3 mm superior to the superior margin of the mass on the 90 degree lateral view. Were marked for Dr. Dalbert Batman. Follow-up survey of the patient confirms presence of the radioactive seed. Order number of I-125 seed:  [id number]. Total activity: 0.248 mCi reference Date: 19 June 2019 The patient tolerated the procedure well and was released from the [facility]. She was given instructions regarding seed removal. IMPRESSION: Radioactive seed localization right breast. No apparent complications. Electronically Signed   By: Curlene Dolphin M.D.   On: 07/02/2019 14:29    Impression:  Stage I (pT1c, pN0) Right Breast UOQ, Invasive Ductal Carcinoma, ER+ / PR+ / Her2-, Grade 2.  Patient would be a good candidate for breast conservation with radiation therapy directed at the right breast.  I discussed the overall treatment course side effects and potential long-term toxicities of radiation therapy in the situation with patient.  She appears to understand and wishes to proceed with treatment.  She would appear to be a good candidate for hypofractionated accelerated radiation therapy.   Plan: Patient did recently move to [place] and in light of this would like to receive her radiation therapy  in this area.  Patient  will be referred to [facility] in [facility] for her adjuvant radiation therapy.  She will decide at a later date where she would like to receive her adjuvant hormonal therapy.   ____________________________________ Gery Pray, MD   This document serves as a record of services personally performed by Gery Pray, MD. It was created on his behalf by Wilburn Mylar, a trained medical scribe. The creation of this record is based on the scribe's personal observations and the provider's statements to them. This document has been checked and approved by the attending provider.

## 2019-07-30 ENCOUNTER — Telehealth: Payer: Self-pay | Admitting: *Deleted

## 2019-07-30 NOTE — Telephone Encounter (Signed)
This patient will be seeing Dr. Posey Pronto on September 1 @ 1 pm @ [facility] in [place]

## 2019-08-13 ENCOUNTER — Encounter: Payer: Self-pay | Admitting: *Deleted

## 2019-09-04 ENCOUNTER — Encounter: Payer: Self-pay | Admitting: *Deleted

## 2019-12-10 ENCOUNTER — Encounter: Payer: Self-pay | Admitting: *Deleted

## 2020-04-25 ENCOUNTER — Ambulatory Visit: Payer: 59 | Admitting: Internal Medicine

## 2020-04-29 ENCOUNTER — Ambulatory Visit: Payer: 59 | Admitting: Internal Medicine

## 2020-05-05 ENCOUNTER — Ambulatory Visit: Payer: PRIVATE HEALTH INSURANCE | Admitting: Internal Medicine

## 2020-05-05 ENCOUNTER — Other Ambulatory Visit: Payer: Self-pay

## 2020-05-05 ENCOUNTER — Encounter: Payer: Self-pay | Admitting: Internal Medicine

## 2020-05-05 VITALS — BP 116/72 | HR 78 | Temp 98.0°F | Ht 60.0 in | Wt 151.2 lb

## 2020-05-05 DIAGNOSIS — E890 Postprocedural hypothyroidism: Secondary | ICD-10-CM | POA: Diagnosis not present

## 2020-05-05 DIAGNOSIS — E042 Nontoxic multinodular goiter: Secondary | ICD-10-CM | POA: Diagnosis not present

## 2020-05-05 LAB — TSH: TSH: 5.94 u[IU]/mL — ABNORMAL HIGH (ref 0.35–4.50)

## 2020-05-05 LAB — T4, FREE: Free T4: 0.88 ng/dL (ref 0.60–1.60)

## 2020-05-05 NOTE — Progress Notes (Addendum)
Name: [name]  MRN/ DOB: [id number], October 17, 1961    Age/ Sex: 59 y.o., female     PCP: Lanice Shirts, MD   Reason for Endocrinology Evaluation: Thyroid nodules     Initial Endocrinology Clinic Visit: 10/31/2018    PATIENT IDENTIFIER: [name] is a 59 y.o., female with a past medical history of crohn's disease,and Hx of Graves' Disease (S/P RAI ablation).  . She has followed with [facility] since 10/31/2018 for consultative assistance with management of her thyroid nodules.   HISTORICAL SUMMARY: The patient was first diagnosed with Graves' disease in 2002. She had a 24-hr uptake of 27% at the time with a TSH of 0.19 uIU/mL. Her thyroid uptake and scan was more consistent with multinodular goiter.  She is S/p RAI ablation in 2002. She has been on Levothyroxine shortly after RAI ablation.    On 10/07/2018, she was incidentally found to have a 13 mm right thyroid nodule on a CT scan for lung cancer screen. This triggered a thyroid ultrasound , which revealed 3 thyroid nodules, two of those with suspicious features.   An FNA of the right mid/inferior nodule and left inferior nodule revealed scant cellularity.   Repeat FNA's of the right Mid and left inferior was repeated on 11/13/2018 with benign cytology.    SUBJECTIVE:    Today (05/06/2020):  [name] is here for a 1 year follow up on thyroid nodules. She denies any local neck symptoms.   Since her last visit she has right a lumpectomy in 06/2020, S/P radiation but no chemo   Energy level has been good. Occasional hot flashes  She is compliant with LT-4 replacement.  Denies local neck symptoms  She has been noted with weight loss, has been exercising  Stools are loose due to crohn's disease      ROS:  As per HPI.   HISTORY:  Past Medical History:  Past Medical History:  Diagnosis Date  . Allergy   . Anal fissure   . Anxiety   . Arthritis    -knees, hips- is bothersome now   . Breast cancer ([place]) 06/03/2019  . Colon polyps   . Crohn disease ([place])    10-27-15   . Delayed gastric emptying   . Depression   . Family history of adverse reaction to anesthesia    daughter has post op N&V  . GERD (gastroesophageal reflux disease)    occasional  . Graves disease    started with this  . H/O steroid therapy    presently on tapering doses of Prednisone, Entercort.  . Hypothyroidism    current  . IBS (irritable bowel syndrome)   . Osteopenia   . Vitamin B12 deficiency    remains under tx.  injections every 4 weeks-today a little more that 4 weeks.   Past Surgical History:  Past Surgical History:  Procedure Laterality Date  . ABDOMINAL HYSTERECTOMY  2004   complete  . APPENDECTOMY  2004  . BOWEL RESECTION  2004   Crohns  . BREAST LUMPECTOMY WITH RADIOACTIVE SEED AND SENTINEL LYMPH NODE BIOPSY Right 07/03/2019   Procedure: RIGHT BREAST LUMPECTOMY WITH RADIOACTIVE SEED AND RIGHT AXILLARY SENTINEL LYMPH NODE BIOPSY DEEP INJECT BLUE DYE RIGHT BREAST;  Surgeon: Fanny Skates, MD;  Location: [facility];  Service: General;  Laterality: Right;  . COLONOSCOPY    . COLONOSCOPY W/ POLYPECTOMY    . COLONOSCOPY WITH PROPOFOL N/A 11/01/2015   Procedure: COLONOSCOPY WITH PROPOFOL;  Surgeon: Garlan Fair, MD;  Location: [facility];  Service: Endoscopy;  Laterality: N/A;  . POLYPECTOMY    . radioiodine therapy  2003  . SMALL INTESTINE SURGERY  01/2019  . TEMPOROMANDIBULAR JOINT SURGERY Left 1990  . TUBAL LIGATION  1989  . UMBILICAL HERNIA REPAIR N/A 08/06/2013   Procedure: HERNIA REPAIR UMBILICAL ADULT;  Surgeon: Odis Hollingshead, MD;  Location: [facility];  Service: General;  Laterality: N/A;  WITH MESH  . UPPER GASTROINTESTINAL ENDOSCOPY      Social History:  reports that she quit smoking about 1 years ago. Her smoking use included cigarettes. She has a 0.50 pack-year smoking history. She has never used smokeless tobacco. She reports current alcohol use. She reports that  she does not use drugs. Family History:  Family History  Problem Relation Age of Onset  . Irritable bowel syndrome Mother   . Colon polyps Father   . Colon cancer Paternal Uncle   . Diabetes Maternal Grandmother        type 2  . Irritable bowel syndrome Maternal Grandmother   . Esophageal cancer Neg Hx   . Stomach cancer Neg Hx   . Rectal cancer Neg Hx      HOME MEDICATIONS: Allergies as of 05/05/2020      Reactions   Mercaptopurine Hives, Shortness Of Breath, Swelling   Aspirin Adult Low [aspirin] Hives   Gluten Meal Other (See Comments)   Distend, abd pain, fever, chills      Medication List       Accurate as of May 05, 2020 11:59 PM. If you have any questions, ask your nurse or doctor.        B COMPLETE PO Take 1 tablet by mouth daily.   cetirizine 10 MG tablet Commonly known as: ZYRTEC Take 10 mg by mouth daily.   Co Q-10 300 MG Caps Take 300 mg by mouth at bedtime.   colestipol 1 g tablet Commonly known as: COLESTID Take 2 g by mouth 2 (two) times daily.   escitalopram 5 MG tablet Commonly known as: LEXAPRO Take 5 mg by mouth daily.   Fish Oil 1000 MG Caps Take 1,000 mg by mouth daily.   HYDROcodone-acetaminophen 5-325 MG tablet Commonly known as: Norco Take 1-2 tablets by mouth every 6 (six) hours as needed for moderate pain or severe pain.   levothyroxine 100 MCG tablet Commonly known as: SYNTHROID Take 1 tablet (100 mcg total) by mouth daily. What changed:   medication strength  how much to take  when to take this Changed by: Dorita Sciara, MD   multivitamin with minerals Tabs tablet Take 1 tablet by mouth daily.   NONFORMULARY OR COMPOUNDED ITEM Place 1 application rectally daily as needed (for anal fissure). Nitroglycerin 0.125% Ointment   sulfaSALAzine 500 MG tablet Commonly known as: AZULFIDINE Take 1,000 mg by mouth 2 (two) times daily.   tamoxifen 20 MG tablet Commonly known as: NOLVADEX Take 20 mg by mouth daily.    Vitamin B-12 2500 MCG Subl Take 2,500 mcg by mouth daily.   Vitamin D3 125 MCG (5000 UT) Caps Take 5,000 Units by mouth daily.   zolpidem 5 MG tablet Commonly known as: Ambien Take 1 tablet (5 mg total) by mouth at bedtime as needed for sleep. What changed: when to take this         OBJECTIVE:   PHYSICAL EXAM: VS: BP 116/72 (BP Location: Left Arm, Patient Position: Sitting, Cuff Size: Normal)   Pulse  78   Temp 98 F (36.7 C)   Ht 5' (1.524 m)   Wt 151 lb 3.2 oz (68.6 kg)   SpO2 98%   BMI 29.53 kg/m    EXAM: General: Pt appears well and is in NAD  Neck: General: Supple without adenopathy. Thyroid: Thyroid size normal.  Right nodule appreciated.  Lungs: Clear with good BS bilat with no rales, rhonchi, or wheezes  Heart: Auscultation: RRR.  Abdomen: Normoactive bowel sounds, soft, nontender, without masses or organomegaly palpable  Extremities:  BL LE: No pretibial edema normal ROM and strength.  Neuro: Cranial nerves: II - XII grossly intact  Motor: Normal strength throughout DTRs: 2+ and symmetric in UE without delay in relaxation phase  Mental Status: Judgment, insight: Intact Orientation: Oriented to time, place, and person Mood and affect: No depression, anxiety, or agitation     DATA REVIEWED:  Results for Donna, Cohen (MRN [id number]) as of 05/06/2020 15:28  Ref. Range 05/05/2020 10:31  TSH Latest Ref Range: 0.35 - 4.50 uIU/mL 5.94 (H)  T4,Free(Direct) Latest Ref Range: 0.60 - 1.60 ng/dL 0.88     Thyroid Ultrasound 05/25/2019  Nodule # 1:  Prior biopsy: No  Location: Right; Mid  Maximum size: 0.8 cm; Other 2 dimensions: 0.7 x 0.7 cm, previously, 0.7 x 0.6 x 0.6 cm  Composition: solid/almost completely solid (2)  Echogenicity: hypoechoic (2)  Shape: not taller-than-wide (0)  Margins: smooth (0)  Echogenic foci: peripheral calcifications (2)  ACR TI-RADS total points: 6.  ACR TI-RADS risk category:  TR4 (4-6  points).  Significant change in size (>/= 20% in two dimensions and minimal increase of 2 mm): No  Change in features: No  Change in ACR TI-RADS risk category: No  ACR TI-RADS recommendations:  Given size (<0.9 cm) and appearance, this nodule does NOT meet TI-RADS criteria for biopsy or dedicated follow-up.  _________________________________________________________  The previously biopsied approximately 1.8 x 1.1 x 0.9 cm hypoechoic nodule within the mid/inferior aspect the right lobe of the thyroid (labeled 2), is unchanged to decreased in size compared to the 10/2018 examination, previously, 1.9 x 1.6 x 1.3 cm. The nodule is again noted to contain internal macrocalcifications. Correlation with previous biopsy results is recommended.  _________________________________________________________  Nodule # 3:  Location: Left; Superior-not definitely seen on the 11/20 19 examination  Maximum size: 0.6 cm; Other 2 dimensions: X 0.4 x 0.4 cm  Composition: solid/almost completely solid (2)  Echogenicity: hypoechoic (2)  Shape: not taller-than-wide (0)  Margins: smooth (0)  Echogenic foci: none (0)  ACR TI-RADS total points: 4.  ACR TI-RADS risk category: TR4 (4-6 points).  ACR TI-RADS recommendations:  Given size (<0.9 cm) and appearance, this nodule does NOT meet TI-RADS criteria for biopsy or dedicated follow-up.  _________________________________________________________  The previously biopsied approximately 1.1 x 0.8 x 0.7 cm hypoechoic nodule within the inferior pole the left lobe of the thyroid (labeled 4) is unchanged to decreased in size compared to 10/2018 examination, previously, 1.3 x 1.0 x 1.0 cm. The nodule is again noted to contain peripheral mural calcifications. Correlation with prior biopsy results is recommended.  _________________________________________________________  Nodule # 5:  Location: Left; Inferior - this  nodule/pseudonodule not definitely seen on the 10/2018 examination.  Maximum size: 0.6 cm; Other 2 dimensions: 0.6 x 0.5 cm  Composition: solid/almost completely solid (2)  Echogenicity: hypoechoic (2)  Shape: not taller-than-wide (0)  Margins: ill-defined (0)  Echogenic foci: none (0)  ACR TI-RADS total points: 4.  ACR TI-RADS  risk category: TR4 (4-6 points).  ACR TI-RADS recommendations:  Given size (<0.9 cm) and appearance, this nodule does NOT meet TI-RADS criteria for biopsy or dedicated follow-up.  _________________________________________________________  IMPRESSION: 1. Similar findings of multinodular goiter. No definitive worrisome new or enlarging thyroid nodules. 2. Previously biopsied right (labeled #2) and left (#4) nodules are unchanged to decreased in size compared to the 10/2018 examination. Correlation with prior biopsy results is recommended. Assuming a benign pathologic diagnosis, repeat sampling and/or continued dedicated follow-up is not recommended.   ASSESSMENT / PLAN / RECOMMENDATIONS:   1. Multinodular Goiter   - No local neck symptoms - An FNA of the right mid/inferior nodule and left inferior nodule revealed scant cellularity, with repeat FNA's of the right Mid and left inferior on 11/13/2018 with benign cytology.  - Will repeat thyroid ultrasound for stability     2. Postablative Hypothyroidism   - She is clinically euthyroid  - Repeat TSH is elevated, will adjust levothyroxine as below    Medications  Levothyroxine 100 mcg daily      F/u in 1 yr or PRN    Pt has moved to [place] and may look for a local endocrinology  Signed electronically by: Mack Guise, MD  [facility]  [address] [place], [place] [address] Phone: [phone] FAX: [phone]      CC: Lanice Shirts, MD [address] [place] [address] Phone:  [phone]  Fax: [phone]   Return to Endocrinology clinic as below: Future Appointments  Date Time Provider [place]  05/18/2020  1:00 PM [facility] [place] [facility]  05/11/2021 10:10 AM Nohemi Nicklaus, Melanie Crazier, MD [facility] None

## 2020-05-05 NOTE — Patient Instructions (Signed)

## 2020-05-06 ENCOUNTER — Other Ambulatory Visit: Payer: Self-pay | Admitting: Internal Medicine

## 2020-05-06 ENCOUNTER — Encounter: Payer: Self-pay | Admitting: Internal Medicine

## 2020-05-06 MED ORDER — LEVOTHYROXINE SODIUM 100 MCG PO TABS: 100.0000 ug | ORAL_TABLET | Freq: Every day | ORAL | 3 refills | Status: DC

## 2020-05-06 NOTE — Progress Notes (Unsigned)
Increasing levothyroxine to 100 mcg daily

## 2020-05-18 ENCOUNTER — Other Ambulatory Visit: Payer: PRIVATE HEALTH INSURANCE

## 2020-05-18 ENCOUNTER — Ambulatory Visit
Admission: RE | Admit: 2020-05-18 | Discharge: 2020-05-18 | Disposition: A | Discharge status: Home / Self Care | Payer: PRIVATE HEALTH INSURANCE | Source: Ambulatory Visit | Attending: Internal Medicine | Admitting: Internal Medicine

## 2020-05-18 DIAGNOSIS — E042 Nontoxic multinodular goiter: Secondary | ICD-10-CM

## 2021-01-29 IMAGING — US US THYROID
1 series · 12 of 25 positions shown · non-contrast
Comparison: 10/16/2018;

CLINICAL DATA: Prior ultrasound follow-up. Follow-up thyroid
nodules. History of I 131 thyroid ablation. History of right mid and
left lower lobe thyroid nodule biopsies performed in [REDACTED] and
again in Friday November, 2018

EXAM:
THYROID ULTRASOUND
TECHNIQUE: Ultrasound examination of the thyroid gland and adjacent soft
tissues was performed.

[Series 1: us thyroid · 0.03mm/px · 12 of 64 slices shown]
[im 3/64]
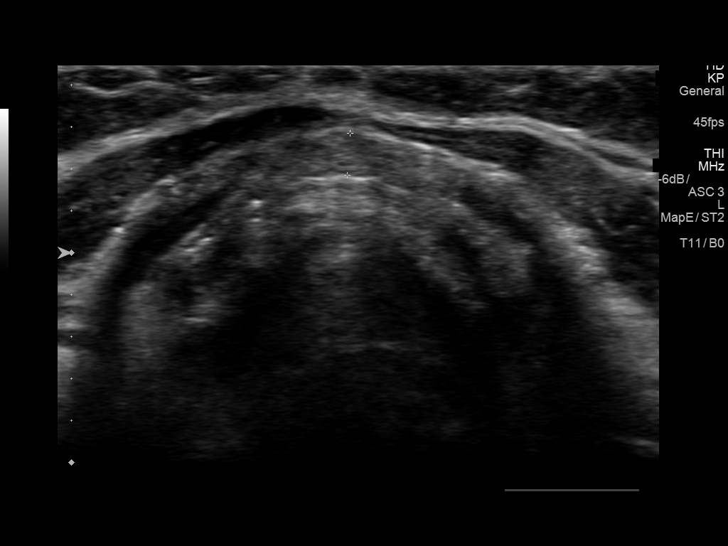
[im 8/64]
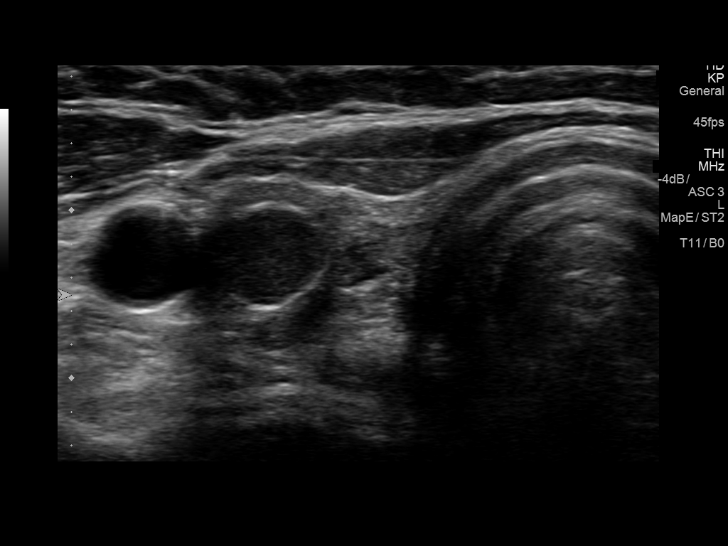
[im 14/64]
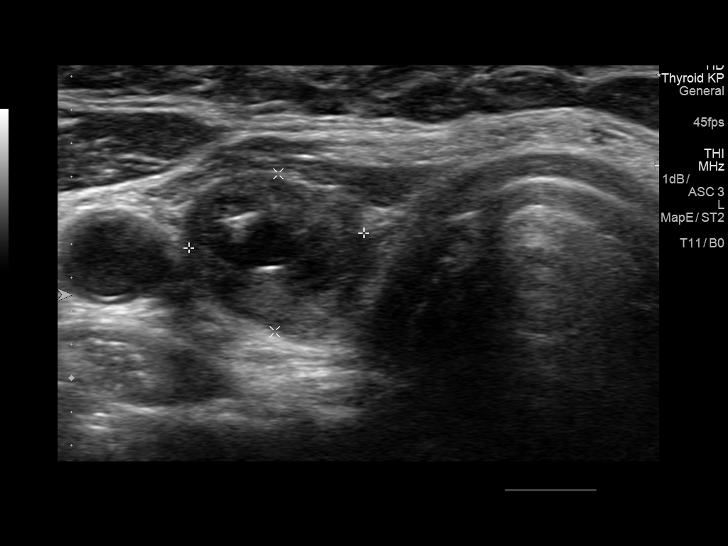
[im 19/64]
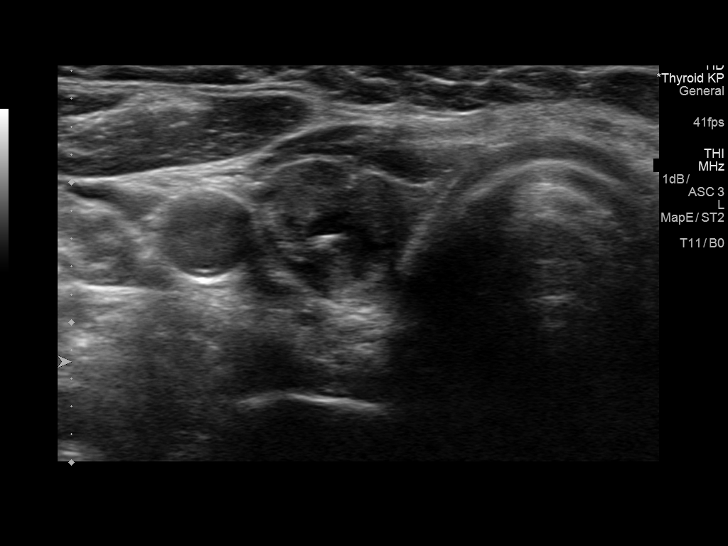
[im 24/64]
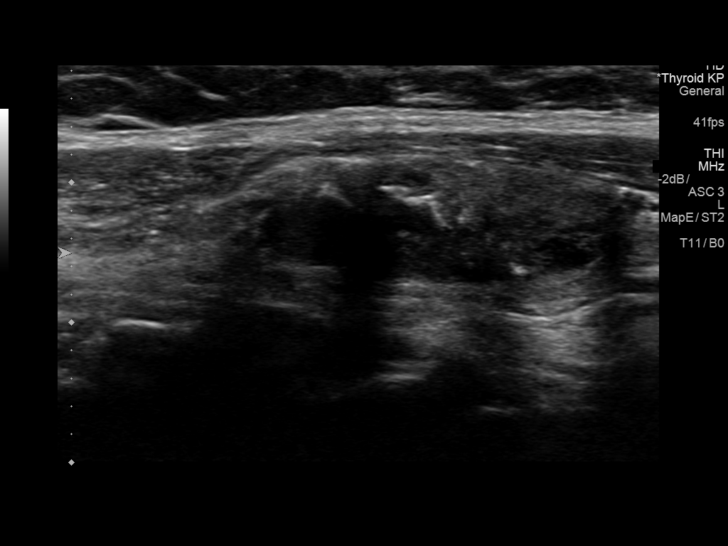
[im 29/64]
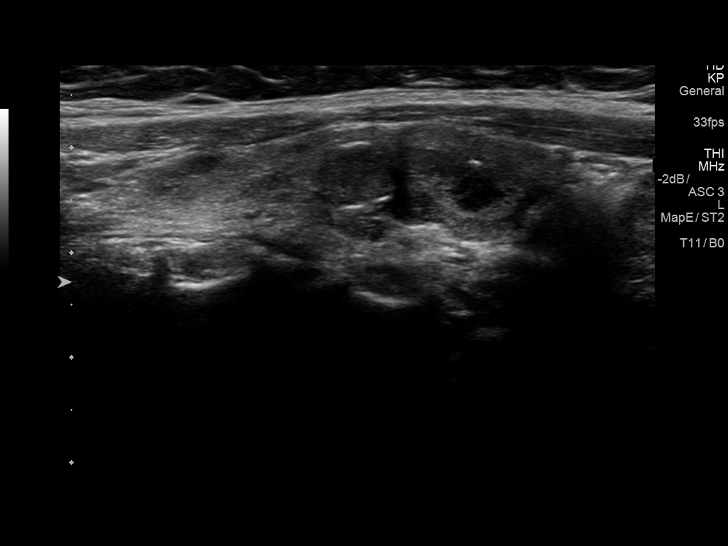
[im 35/64]
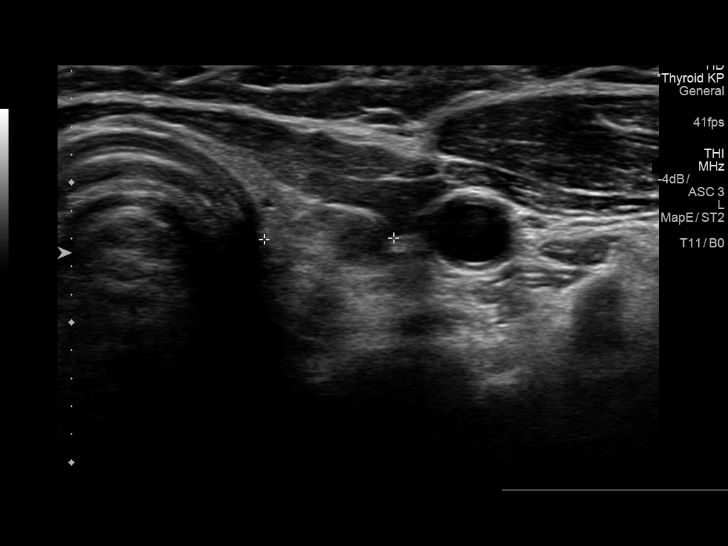
[im 40/64]
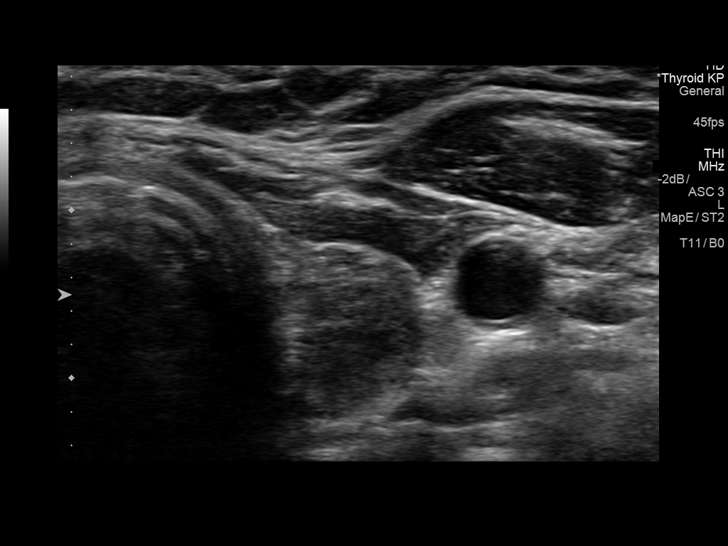
[im 45/64]
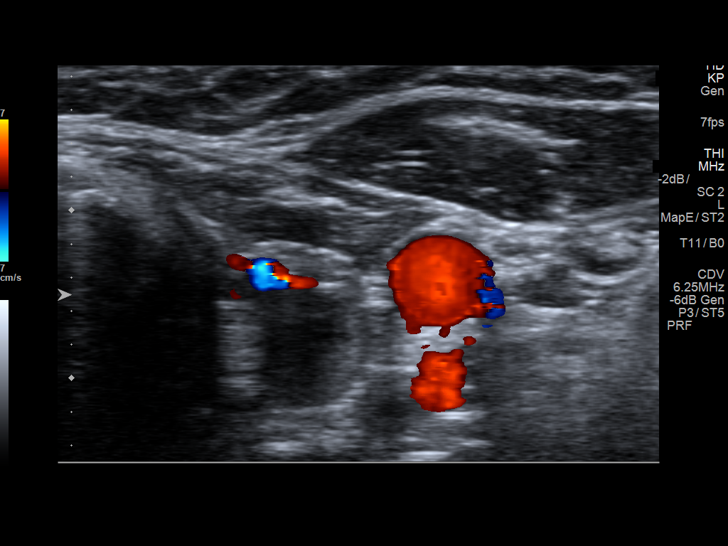
[im 50/64]
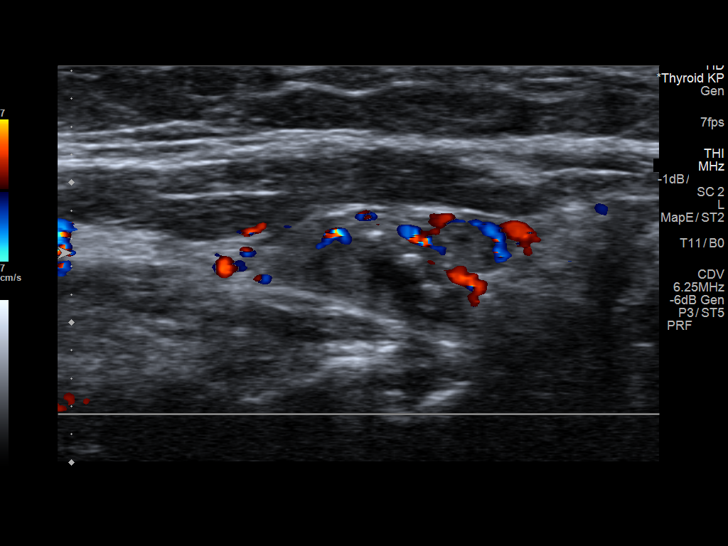
[im 56/64]
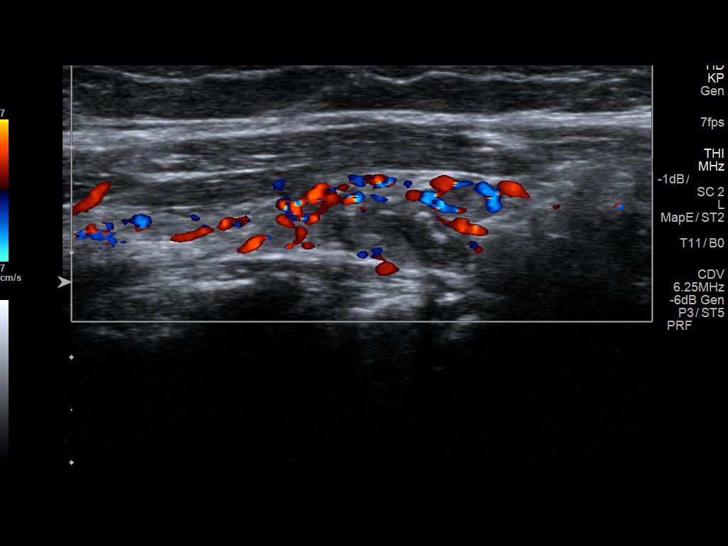
[im 61/64]
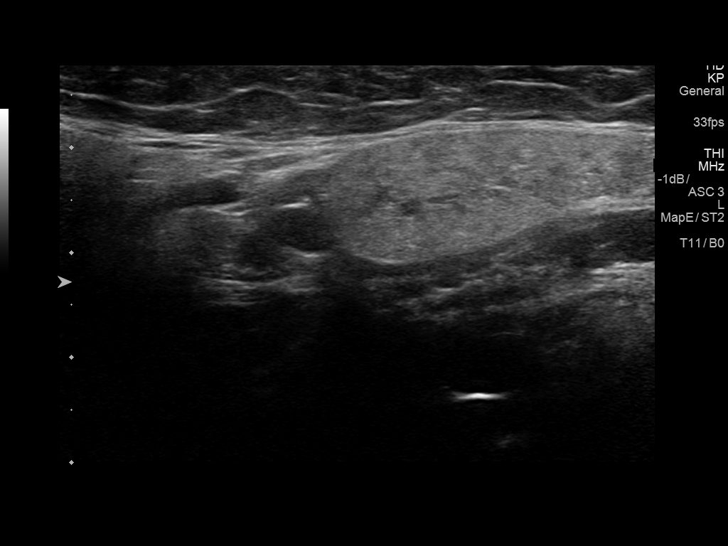

[12 of 25 positions shown; findings below may reference images not displayed]

right mid and left inferior thyroid nodule
fine-needle aspiration-11/13/2018 and 10/23/2018
FINDINGS: Parenchymal Echotexture: Markedly heterogenous

Isthmus: Normal in size measures 0.2 cm in diameter, unchanged

Right lobe: Normal in size measuring 4.7 x 1.0 x 1.3 cm, unchanged
previously, 4.6 x 2.1 x 1.3 cm

Left lobe: Slightly diminutive in size measuring 3.7 x 0.9 x 0.9 cm,
unchanged, previously, 3.5 x 1.0 x 1.2 cm

_________________________________________________________

Estimated total number of nodules >/= 1 cm: 2

Number of spongiform nodules >/=  2 cm not described below (TR1): 0

Number of mixed cystic and solid nodules >/= 1.5 cm not described
below (TR2): 0

_________________________________________________________

Nodule # 1:

Prior biopsy: No

Location: Right; Mid

Maximum size: 0.8 cm; Other 2 dimensions: 0.7 x 0.7 cm, previously,
0.7 x 0.6 x 0.6 cm

Composition: solid/almost completely solid (2)

Echogenicity: hypoechoic (2)

Shape: not taller-than-wide (0)

Margins: smooth (0)

Echogenic foci: peripheral calcifications (2)

ACR TI-RADS total points: 6.

ACR TI-RADS risk category:  TR4 (4-6 points).

Significant change in size (>/= 20% in two dimensions and minimal
increase of 2 mm): No

Change in features: No

Change in ACR TI-RADS risk category: No

ACR TI-RADS recommendations:

Given size (<0.9 cm) and appearance, this nodule does NOT meet
TI-RADS criteria for biopsy or dedicated follow-up.

_________________________________________________________

The previously biopsied approximately 1.8 x 1.1 x 0.9 cm hypoechoic
nodule within the mid/inferior aspect the right lobe of the thyroid
(labeled 2), is unchanged to decreased in size compared to the
[DATE] examination, previously, 1.9 x 1.6 x 1.3 cm. The nodule is
again noted to contain internal macrocalcifications. Correlation
with previous biopsy results is recommended.

_________________________________________________________

Nodule # 3:

Location: Left; Superior-not definitely seen on the [DATE] 19
examination

Maximum size: 0.6 cm; Other 2 dimensions: X 0.4 x 0.4 cm

Composition: solid/almost completely solid (2)

Echogenicity: hypoechoic (2)

Shape: not taller-than-wide (0)

Margins: smooth (0)

Echogenic foci: none (0)

ACR TI-RADS total points: 4.

ACR TI-RADS risk category: TR4 (4-6 points).

ACR TI-RADS recommendations:

Given size (<0.9 cm) and appearance, this nodule does NOT meet
TI-RADS criteria for biopsy or dedicated follow-up.

_________________________________________________________

The previously biopsied approximately 1.1 x 0.8 x 0.7 cm hypoechoic
nodule within the inferior pole the left lobe of the thyroid
(labeled 4) is unchanged to decreased in size compared to [DATE]
examination, previously, 1.3 x 1.0 x 1.0 cm. The nodule is again
noted to contain peripheral mural calcifications. Correlation with
prior biopsy results is recommended.

_________________________________________________________

Nodule # 5:

Location: Left; Inferior - this nodule/pseudonodule not definitely
seen on the [DATE] examination.

Maximum size: 0.6 cm; Other 2 dimensions: 0.6 x 0.5 cm

Composition: solid/almost completely solid (2)

Echogenicity: hypoechoic (2)

Shape: not taller-than-wide (0)

Margins: ill-defined (0)

Echogenic foci: none (0)

ACR TI-RADS total points: 4.

ACR TI-RADS risk category: TR4 (4-6 points).

ACR TI-RADS recommendations:

Given size (<0.9 cm) and appearance, this nodule does NOT meet
TI-RADS criteria for biopsy or dedicated follow-up.

_________________________________________________________
IMPRESSION: 1. Similar findings of multinodular goiter. No definitive worrisome
new or enlarging thyroid nodules.
2. Previously biopsied right (labeled #2) and left (#4) nodules are
unchanged to decreased in size compared to the [DATE] examination.
Correlation with prior biopsy results is recommended. Assuming a
benign pathologic diagnosis, repeat sampling and/or continued
dedicated follow-up is not recommended.

The above is in keeping with the ACR TI-RADS recommendations - [HOSPITAL] 9260;[DATE].

## 2021-05-04 ENCOUNTER — Ambulatory Visit: Payer: PRIVATE HEALTH INSURANCE | Admitting: Internal Medicine

## 2021-05-04 ENCOUNTER — Encounter: Payer: Self-pay | Admitting: Internal Medicine

## 2021-05-04 ENCOUNTER — Other Ambulatory Visit: Payer: Self-pay

## 2021-05-04 VITALS — BP 116/76 | HR 80 | Ht 60.0 in | Wt 163.1 lb

## 2021-05-04 DIAGNOSIS — E042 Nontoxic multinodular goiter: Secondary | ICD-10-CM | POA: Diagnosis not present

## 2021-05-04 DIAGNOSIS — E890 Postprocedural hypothyroidism: Secondary | ICD-10-CM

## 2021-05-04 LAB — TSH: TSH: 2.09 u[IU]/mL (ref 0.35–4.50)

## 2021-05-04 NOTE — Progress Notes (Signed)
Name: [name]  MRN/ DOB: [id number], 08/06/1961    Age/ Sex: 60 y.o., female     PCP: System, Provider Not In   Reason for Endocrinology Evaluation: Thyroid nodules     Initial Endocrinology Clinic Visit: 10/31/2018    PATIENT IDENTIFIER: [name] is a 60 y.o., female with a past medical history of crohn's disease,and Hx of Graves' Disease (S/P RAI ablation).  . She has followed with [facility] since 10/31/2018 for consultative assistance with management of her thyroid nodules.   HISTORICAL SUMMARY: The patient was first diagnosed with Graves' disease in 2002. She had a 24-hr uptake of 27% at the time with a TSH of 0.19 uIU/mL. Her thyroid uptake and scan was more consistent with multinodular goiter.  She is S/p RAI ablation in 2002. She has been on Levothyroxine shortly after RAI ablation.    On 10/07/2018, she was incidentally found to have a 13 mm right thyroid nodule on a CT scan for lung cancer screen. This triggered a thyroid ultrasound , which revealed 3 thyroid nodules, two of those with suspicious features.   An FNA of the right mid/inferior nodule and left inferior nodule revealed scant cellularity.   Repeat FNA's of the right Mid and left inferior was repeated on 11/13/2018 with benign cytology.    SUBJECTIVE:    Today (05/04/2021):  [name] is here for a 1 year follow up on thyroid nodules.  She loves [place]    She denies any local neck symptoms.   Since her last visit she has right a lumpectomy in 06/2020, S/P radiation but no chemo . She stopped Tamoxifen due to depression as well as hot flashes     She is compliant with LT-4 replacement.  Denies local neck symptoms Weight has been increasing  Crohn's disease  Is stable   Levothyroxine 100 mcg daily    HISTORY:  Past Medical History:  Past Medical History:  Diagnosis Date  . Allergy   . Anal fissure   . Anxiety   . Arthritis    -knees, hips- is bothersome now   . Breast cancer ([place]) 06/03/2019  . Colon polyps   . Crohn disease ([place])    10-27-15   . Delayed gastric emptying   . Depression   . Family history of adverse reaction to anesthesia    daughter has post op N&V  . GERD (gastroesophageal reflux disease)    occasional  . Graves disease    started with this  . H/O steroid therapy    presently on tapering doses of Prednisone, Entercort.  . Hypothyroidism    current  . IBS (irritable bowel syndrome)   . Osteopenia   . Vitamin B12 deficiency    remains under tx.  injections every 4 weeks-today a little more that 4 weeks.   Past Surgical History:  Past Surgical History:  Procedure Laterality Date  . ABDOMINAL HYSTERECTOMY  2004   complete  . APPENDECTOMY  2004  . BOWEL RESECTION  2004   Crohns  . BREAST LUMPECTOMY WITH RADIOACTIVE SEED AND SENTINEL LYMPH NODE BIOPSY Right 07/03/2019   Procedure: RIGHT BREAST LUMPECTOMY WITH RADIOACTIVE SEED AND RIGHT AXILLARY SENTINEL LYMPH NODE BIOPSY DEEP INJECT BLUE DYE RIGHT BREAST;  Surgeon: Fanny Skates, MD;  Location: [facility];  Service: General;  Laterality: Right;  . COLONOSCOPY    . COLONOSCOPY W/ POLYPECTOMY    . COLONOSCOPY WITH PROPOFOL N/A 11/01/2015   Procedure: COLONOSCOPY WITH PROPOFOL;  Surgeon: Garlan Fair, MD;  Location: [facility];  Service: Endoscopy;  Laterality: N/A;  . POLYPECTOMY    . radioiodine therapy  2003  . SMALL INTESTINE SURGERY  01/2019  . TEMPOROMANDIBULAR JOINT SURGERY Left 1990  . TUBAL LIGATION  1989  . UMBILICAL HERNIA REPAIR N/A 08/06/2013   Procedure: HERNIA REPAIR UMBILICAL ADULT;  Surgeon: Odis Hollingshead, MD;  Location: [facility];  Service: General;  Laterality: N/A;  WITH MESH  . UPPER GASTROINTESTINAL ENDOSCOPY      Social History:  reports that she quit smoking about 2 years ago. Her smoking use included cigarettes. She has a 0.50 pack-year smoking history. She has never used smokeless tobacco. She reports current alcohol use. She reports that  she does not use drugs. Family History:  Family History  Problem Relation Age of Onset  . Irritable bowel syndrome Mother   . Colon polyps Father   . Colon cancer Paternal Uncle   . Diabetes Maternal Grandmother        type 2  . Irritable bowel syndrome Maternal Grandmother   . Esophageal cancer Neg Hx   . Stomach cancer Neg Hx   . Rectal cancer Neg Hx      HOME MEDICATIONS: Allergies as of 05/04/2021      Reactions   Mercaptopurine Hives, Shortness Of Breath, Swelling   Aspirin Adult Low [aspirin] Hives   Gluten Meal Other (See Comments)   Distend, abd pain, fever, chills      Medication List       Accurate as of May 04, 2021  1:49 PM. If you have any questions, ask your nurse or doctor.        STOP taking these medications   B COMPLETE PO Stopped by: Dorita Sciara, MD   escitalopram 5 MG tablet Commonly known as: LEXAPRO Stopped by: Dorita Sciara, MD   HYDROcodone-acetaminophen 5-325 MG tablet Commonly known as: Norco Stopped by: Dorita Sciara, MD   tamoxifen 20 MG tablet Commonly known as: NOLVADEX Stopped by: Dorita Sciara, MD   Vitamin B-12 2500 MCG Subl Stopped by: Dorita Sciara, MD     TAKE these medications   cetirizine 10 MG tablet Commonly known as: ZYRTEC Take 10 mg by mouth daily.   Co Q-10 300 MG Caps Take 300 mg by mouth at bedtime.   colestipol 1 g tablet Commonly known as: COLESTID Take 3 g by mouth daily.   Fish Oil 1000 MG Caps Take 1,000 mg by mouth daily.   levothyroxine 100 MCG tablet Commonly known as: SYNTHROID Take 1 tablet (100 mcg total) by mouth daily.   multivitamin with minerals Tabs tablet Take 1 tablet by mouth daily.   NONFORMULARY OR COMPOUNDED ITEM Place 1 application rectally daily as needed (for anal fissure). Nitroglycerin 0.125% Ointment   sulfaSALAzine 500 MG tablet Commonly known as: AZULFIDINE Take 1,000 mg by mouth 2 (two) times daily.   Vitamin D3 125 MCG (5000  UT) Caps Take 5,000 Units by mouth daily.   zolpidem 5 MG tablet Commonly known as: Ambien Take 1 tablet (5 mg total) by mouth at bedtime as needed for sleep. What changed: when to take this         OBJECTIVE:   PHYSICAL EXAM: VS: BP 116/76   Pulse 80   Ht 5' (1.524 m)   Wt 163 lb 2 oz (74 kg)   SpO2 98%   BMI 31.86 kg/m    EXAM: General: Pt appears  well and is in NAD  Neck: General: Supple without adenopathy. Thyroid: Thyroid size normal.  Right nodule appreciated.  Lungs: Clear with good BS bilat with no rales, rhonchi, or wheezes  Heart: Auscultation: RRR.  Abdomen: Normoactive bowel sounds, soft, nontender, without masses or organomegaly palpable  Extremities:  BL LE: No pretibial edema normal ROM and strength.  Neuro: Cranial nerves: II - XII grossly intact  Motor: Normal strength throughout DTRs: 2+ and symmetric in UE without delay in relaxation phase  Mental Status: Judgment, insight: Intact Orientation: Oriented to time, place, and person Mood and affect: No depression, anxiety, or agitation     DATA REVIEWED: Results for [name], [name] (MRN [id number]) as of 05/05/2021 17:53  Ref. Range 05/04/2021 14:29  TSH Latest Ref Range: 0.35 - 4.50 uIU/mL 2.09   Thyroid Ultrasound 05/25/2019  Nodule # 1:  Prior biopsy: No  Location: Right; Mid  Maximum size: 0.8 cm; Other 2 dimensions: 0.7 x 0.7 cm, previously, 0.7 x 0.6 x 0.6 cm  Composition: solid/almost completely solid (2)  Echogenicity: hypoechoic (2)  Shape: not taller-than-wide (0)  Margins: smooth (0)  Echogenic foci: peripheral calcifications (2)  ACR TI-RADS total points: 6.  ACR TI-RADS risk category:  TR4 (4-6 points).  Significant change in size (>/= 20% in two dimensions and minimal increase of 2 mm): No  Change in features: No  Change in ACR TI-RADS risk category: No  ACR TI-RADS recommendations:  Given size (<0.9 cm) and appearance, this nodule does NOT  meet TI-RADS criteria for biopsy or dedicated follow-up.  _________________________________________________________  The previously biopsied approximately 1.8 x 1.1 x 0.9 cm hypoechoic nodule within the mid/inferior aspect the right lobe of the thyroid (labeled 2), is unchanged to decreased in size compared to the 10/2018 examination, previously, 1.9 x 1.6 x 1.3 cm. The nodule is again noted to contain internal macrocalcifications. Correlation with previous biopsy results is recommended.  _________________________________________________________  Nodule # 3:  Location: Left; Superior-not definitely seen on the 11/20 19 examination  Maximum size: 0.6 cm; Other 2 dimensions: X 0.4 x 0.4 cm  Composition: solid/almost completely solid (2)  Echogenicity: hypoechoic (2)  Shape: not taller-than-wide (0)  Margins: smooth (0)  Echogenic foci: none (0)  ACR TI-RADS total points: 4.  ACR TI-RADS risk category: TR4 (4-6 points).  ACR TI-RADS recommendations:  Given size (<0.9 cm) and appearance, this nodule does NOT meet TI-RADS criteria for biopsy or dedicated follow-up.  _________________________________________________________  The previously biopsied approximately 1.1 x 0.8 x 0.7 cm hypoechoic nodule within the inferior pole the left lobe of the thyroid (labeled 4) is unchanged to decreased in size compared to 10/2018 examination, previously, 1.3 x 1.0 x 1.0 cm. The nodule is again noted to contain peripheral mural calcifications. Correlation with prior biopsy results is recommended.  _________________________________________________________  Nodule # 5:  Location: Left; Inferior - this nodule/pseudonodule not definitely seen on the 10/2018 examination.  Maximum size: 0.6 cm; Other 2 dimensions: 0.6 x 0.5 cm  Composition: solid/almost completely solid (2)  Echogenicity: hypoechoic (2)  Shape: not taller-than-wide (0)  Margins:  ill-defined (0)  Echogenic foci: none (0)  ACR TI-RADS total points: 4.  ACR TI-RADS risk category: TR4 (4-6 points).  ACR TI-RADS recommendations:  Given size (<0.9 cm) and appearance, this nodule does NOT meet TI-RADS criteria for biopsy or dedicated follow-up.  _________________________________________________________  IMPRESSION: 1. Similar findings of multinodular goiter. No definitive worrisome new or enlarging thyroid nodules. 2. Previously biopsied right (labeled #2) and  left (#4) nodules are unchanged to decreased in size compared to the 10/2018 examination. Correlation with prior biopsy results is recommended. Assuming a benign pathologic diagnosis, repeat sampling and/or continued dedicated follow-up is not recommended.   ASSESSMENT / PLAN / RECOMMENDATIONS:   1. Multinodular Goiter   - No local neck symptoms - An FNA of the right mid/inferior nodule and left inferior nodule revealed scant cellularity, with repeat FNA's of the right Mid and left inferior on 11/13/2018 with benign cytology.  - Will repeat thyroid ultrasound for stability     2. Postablative Hypothyroidism   - She is clinically euthyroid  - Repeat TSH is normal we will continue current dose of levothyroxine   Medications  Levothyroxine 100 mcg daily      F/u in 1 yr or PRN    Pt has moved to [place] and may look for a local endocrinology  Signed electronically by: Mack Guise, MD  [facility]  [facility] [address] [place], [place] [address] Phone: [phone] FAX: [phone]      CC: System, Provider Not In No address on file Phone: None  Fax: None   Return to Endocrinology clinic as below: No future appointments.

## 2021-05-05 MED ORDER — LEVOTHYROXINE SODIUM 100 MCG PO TABS: 100.0000 ug | ORAL_TABLET | Freq: Every day | ORAL | 3 refills | Status: DC

## 2021-05-10 HISTORY — PX: LAPAROSCOPIC CHOLECYSTECTOMY: SUR755

## 2021-05-11 ENCOUNTER — Ambulatory Visit: Payer: PRIVATE HEALTH INSURANCE | Admitting: Internal Medicine

## 2021-05-31 ENCOUNTER — Ambulatory Visit
Admission: RE | Admit: 2021-05-31 | Discharge: 2021-05-31 | Disposition: A | Discharge status: Home / Self Care | Payer: PRIVATE HEALTH INSURANCE | Source: Ambulatory Visit | Attending: Internal Medicine | Admitting: Internal Medicine

## 2021-05-31 DIAGNOSIS — E042 Nontoxic multinodular goiter: Secondary | ICD-10-CM

## 2021-06-02 ENCOUNTER — Telehealth: Payer: Self-pay | Admitting: Internal Medicine

## 2021-06-02 DIAGNOSIS — E042 Nontoxic multinodular goiter: Secondary | ICD-10-CM

## 2021-06-02 NOTE — Telephone Encounter (Signed)
Discussed thyroid ultrasound results with the patient on 06/02/2021 at 1500  Thyroid ultrasound 05/31/2021  Estimated total number of nodules >/= 1 cm: 2   Number of spongiform nodules >/=  2 cm not described below (TR1): 0   Number of mixed cystic and solid nodules >/= 1.5 cm not described below ([place]): 0   _________________________________________________________   Unchanged 0.8 cm hypoechoic nodule in the right superior thyroid which does not meet criteria for dedicated ultrasound follow-up or tissue sampling.   Similar appearance of the previously biopsied right mid thyroid nodule (labeled 2, 1.9 cm, previously 2.0 cm).   Mild interval enlargement of previously biopsied left mid solid appearing, ill-defined hypoechoic nodule (labeled 3, 1.5 cm, previously 1.2 cm).   IMPRESSION: 1. Similar appearing multinodular goiter with diffuse post ablated changes. 2. Similar appearance of previously biopsied right and left mid thyroid nodules. 3. No new discrete worrisome thyroid nodules.      Discussion There is mid interval enlargement of previously biopsied left mid nodule..   Current measurement of the left mid thyroid nodule 0.90 x 0.80 x 1.5  Measurements from 2021 0.70 x 0.80 x 1.2    The patient shows 20% increase in 2 dimensions which meets ATA criteria for another biopsy despite having a benign biopsy in 2019.  She is in agreement of this

## 2021-06-21 ENCOUNTER — Other Ambulatory Visit: Payer: PRIVATE HEALTH INSURANCE

## 2021-06-22 ENCOUNTER — Other Ambulatory Visit: Payer: PRIVATE HEALTH INSURANCE

## 2021-07-06 ENCOUNTER — Other Ambulatory Visit (HOSPITAL_COMMUNITY)
Admission: RE | Admit: 2021-07-06 | Discharge: 2021-07-06 | Disposition: A | Discharge status: Home / Self Care | Payer: No Typology Code available for payment source | Source: Ambulatory Visit | Attending: Internal Medicine | Admitting: Internal Medicine

## 2021-07-06 ENCOUNTER — Ambulatory Visit
Admission: RE | Admit: 2021-07-06 | Discharge: 2021-07-06 | Disposition: A | Discharge status: Home / Self Care | Payer: No Typology Code available for payment source | Source: Ambulatory Visit | Attending: Internal Medicine | Admitting: Internal Medicine

## 2021-07-06 ENCOUNTER — Other Ambulatory Visit: Payer: Self-pay

## 2021-07-06 DIAGNOSIS — E042 Nontoxic multinodular goiter: Secondary | ICD-10-CM | POA: Diagnosis present

## 2021-07-06 DIAGNOSIS — D440 Neoplasm of uncertain behavior of thyroid gland: Secondary | ICD-10-CM | POA: Diagnosis not present

## 2021-07-07 LAB — CYTOLOGY - NON PAP

## 2021-07-10 ENCOUNTER — Telehealth: Payer: Self-pay | Admitting: Internal Medicine

## 2021-07-10 DIAGNOSIS — E042 Nontoxic multinodular goiter: Secondary | ICD-10-CM

## 2021-07-10 NOTE — Telephone Encounter (Signed)
Discussed FNA results with the patient on 07/10/2021 with scant cellularity of the left thyroid nodule.   We discussed options of repeating FNA in 3 months versus short-term ultrasound within 6 months   We have agreed to proceeding with a thyroid ultrasound in 6 months    [place], MD  [facility]  [facility] [address] [place], [place] [address] Phone: [phone] FAX: [phone]

## 2021-12-18 ENCOUNTER — Other Ambulatory Visit: Payer: No Typology Code available for payment source

## 2022-04-04 ENCOUNTER — Other Ambulatory Visit: Payer: Self-pay

## 2022-04-04 MED ORDER — LEVOTHYROXINE SODIUM 100 MCG PO TABS: 100.0000 ug | ORAL_TABLET | Freq: Every day | ORAL | 0 refills | Status: AC

## 2022-04-30 ENCOUNTER — Ambulatory Visit: Payer: PRIVATE HEALTH INSURANCE | Admitting: Internal Medicine
